# Patient Record
Sex: Female | Born: 1945 | Race: White | Hispanic: No | Marital: Married | Smoking: Never smoker
Health system: Southern US, Community
[De-identification: ages and names within clinical notes are randomized; demographics above are authoritative.]

## PROBLEM LIST (undated history)

## (undated) DIAGNOSIS — T7840XA Allergy, unspecified, initial encounter: Secondary | ICD-10-CM

## (undated) DIAGNOSIS — C50919 Malignant neoplasm of unspecified site of unspecified female breast: Secondary | ICD-10-CM

## (undated) DIAGNOSIS — K219 Gastro-esophageal reflux disease without esophagitis: Secondary | ICD-10-CM

## (undated) DIAGNOSIS — R0600 Dyspnea, unspecified: Secondary | ICD-10-CM

## (undated) DIAGNOSIS — Z8719 Personal history of other diseases of the digestive system: Secondary | ICD-10-CM

## (undated) DIAGNOSIS — I10 Essential (primary) hypertension: Secondary | ICD-10-CM

## (undated) DIAGNOSIS — R918 Other nonspecific abnormal finding of lung field: Secondary | ICD-10-CM

## (undated) DIAGNOSIS — J189 Pneumonia, unspecified organism: Secondary | ICD-10-CM

## (undated) DIAGNOSIS — E669 Obesity, unspecified: Secondary | ICD-10-CM

## (undated) DIAGNOSIS — H269 Unspecified cataract: Secondary | ICD-10-CM

## (undated) DIAGNOSIS — C4490 Unspecified malignant neoplasm of skin, unspecified: Secondary | ICD-10-CM

## (undated) DIAGNOSIS — Z973 Presence of spectacles and contact lenses: Secondary | ICD-10-CM

## (undated) DIAGNOSIS — M1990 Unspecified osteoarthritis, unspecified site: Secondary | ICD-10-CM

## (undated) HISTORY — PX: WISDOM TOOTH EXTRACTION: SHX21

## (undated) HISTORY — PX: ABDOMINAL HYSTERECTOMY: SHX81

## (undated) HISTORY — DX: Unspecified malignant neoplasm of skin, unspecified: C44.90

## (undated) HISTORY — DX: Essential (primary) hypertension: I10

## (undated) HISTORY — DX: Malignant neoplasm of unspecified site of unspecified female breast: C50.919

## (undated) HISTORY — DX: Obesity, unspecified: E66.9

## (undated) HISTORY — DX: Unspecified osteoarthritis, unspecified site: M19.90

## (undated) HISTORY — PX: SKIN CANCER EXCISION: SHX779

## (undated) HISTORY — PX: BREAST SURGERY: SHX581

## (undated) HISTORY — DX: Allergy, unspecified, initial encounter: T78.40XA

---

## 2019-06-30 ENCOUNTER — Other Ambulatory Visit: Payer: Self-pay

## 2019-06-30 ENCOUNTER — Ambulatory Visit (INDEPENDENT_AMBULATORY_CARE_PROVIDER_SITE_OTHER): Payer: Medicare Other | Admitting: Internal Medicine

## 2019-06-30 ENCOUNTER — Encounter: Payer: Self-pay | Admitting: Internal Medicine

## 2019-06-30 VITALS — BP 130/80 | HR 67 | Temp 98.2°F | Ht 67.0 in | Wt 184.0 lb

## 2019-06-30 DIAGNOSIS — R05 Cough: Secondary | ICD-10-CM

## 2019-06-30 DIAGNOSIS — R911 Solitary pulmonary nodule: Secondary | ICD-10-CM | POA: Diagnosis not present

## 2019-06-30 DIAGNOSIS — R918 Other nonspecific abnormal finding of lung field: Secondary | ICD-10-CM

## 2019-06-30 DIAGNOSIS — R053 Chronic cough: Secondary | ICD-10-CM

## 2019-06-30 LAB — CBC WITH DIFFERENTIAL/PLATELET
Basophils Absolute: 0 10*3/uL (ref 0.0–0.1)
Basophils Relative: 0.4 % (ref 0.0–3.0)
Eosinophils Absolute: 0.1 10*3/uL (ref 0.0–0.7)
Eosinophils Relative: 1.1 % (ref 0.0–5.0)
HCT: 42.9 % (ref 36.0–46.0)
Hemoglobin: 14.2 g/dL (ref 12.0–15.0)
Lymphocytes Relative: 36.7 % (ref 12.0–46.0)
Lymphs Abs: 3 10*3/uL (ref 0.7–4.0)
MCHC: 33.1 g/dL (ref 30.0–36.0)
MCV: 88.7 fl (ref 78.0–100.0)
Monocytes Absolute: 0.7 10*3/uL (ref 0.1–1.0)
Monocytes Relative: 8.2 % (ref 3.0–12.0)
Neutro Abs: 4.3 10*3/uL (ref 1.4–7.7)
Neutrophils Relative %: 53.6 % (ref 43.0–77.0)
Platelets: 228 10*3/uL (ref 150.0–400.0)
RBC: 4.83 Mil/uL (ref 3.87–5.11)
RDW: 13.9 % (ref 11.5–15.5)
WBC: 8.1 10*3/uL (ref 4.0–10.5)

## 2019-06-30 LAB — SEDIMENTATION RATE: Sed Rate: 18 mm/hr (ref 0–30)

## 2019-06-30 NOTE — Progress Notes (Signed)
Rebekah Taylor, female    DOB: [birth year]      MRN: [id number]    Brief patient profile:  40 yowf never smoker grew up in [place] where she went to [facility] [employer] and lived all over the country but no problem until 2010 after moving to [place] around 2007 with pnds and cough all year round but worse in spring seemed some better on clariton/flonase  Then dx pna Dec 2019 and since then developed chronic dry cough she felt was different from her usual and did not respond to singulair or zyrtec referred to pulmonary clinic 06/30/2019 by Dr   Teryl Lucy       L mastectomy no RT or chemo   In [place] in late Oct 2019 started running fever while there then early Dec 2020 was told she had pna, fever resolved but not the cough.  freq trips to [place]      History of Present Illness  06/30/2019  Pulmonary/ 1st office eval/Terria Deschepper  Chief Complaint  Patient presents with  . Pulmonary Consult    Referred by Dr.  Ledell Peoples for eval of cough and abnormal ct chest.  Pt c/o cough since Dec 2019. Cough is non prod and esp worse at night.  Dyspnea:  Now MMRC1 = can walk nl pace, flat grade, can't hurry or go uphills or steps s sob   Cough: dry daytime no flare up / use of voice worse  Sleep: fine on side  SABA use: none  Overt hb intermittent x 2018/ rx with rolaids  No obvious day to day or daytime variability or assoc excess/ purulent sputum or mucus plugs or hemoptysis or cp or chest tightness, subjective wheeze or overt sinus   symptoms.   Sleeps  without nocturnal  or early am exacerbation  of respiratory  c/o's or need for noct saba. Also denies any obvious fluctuation of symptoms with weather or environmental changes or other aggravating or alleviating factors except as outlined above   No unusual exposure hx or h/o childhood pna/ asthma or knowledge of premature birth.  Current Allergies, Complete Past Medical History, Past Surgical History, Family History, and Social  History were reviewed in [employer] record.  ROS  The following are not active complaints unless bolded Hoarseness, sore throat, dysphagia, dental problems, itching, sneezing,  nasal congestion or discharge of excess mucus or purulent secretions, ear ache,   fever, chills, sweats, unintended  wt loss or wt gain, classically pleuritic or exertional cp,  orthopnea pnd or arm/hand swelling  or leg swelling, presyncope, palpitations, abdominal pain, anorexia, nausea, vomiting, diarrhea  or change in bowel habits or change in bladder habits, change in stools or change in urine, dysuria, hematuria,  rash, arthralgias, visual complaints, headache, numbness, weakness or ataxia or problems with walking or coordination,  change in mood or  memory.           No past medical history on file.  Outpatient Medications Prior to Visit  Medication Sig Dispense Refill  . b complex vitamins tablet Take 1 tablet by mouth daily.    . cetirizine (ZYRTEC) 10 MG tablet Take 10 mg by mouth daily.    . cholecalciferol (VITAMIN D3) 25 MCG (1000 UT) tablet Take 1,000 Units by mouth daily.    . famotidine (PEPCID) 20 MG tablet Take 1 tablet by mouth daily.    . fluticasone (FLONASE) 50 MCG/ACT nasal spray Place 1 spray into both nostrils daily.    Marland Kitchen  hydrochlorothiazide (HYDRODIURIL) 12.5 MG tablet Take 1 tablet by mouth daily.    . montelukast (SINGULAIR) 10 MG tablet Take 10 mg by mouth daily.    . Omega-3 Fatty Acids (SALMON OIL-1000 PO) Take 1 capsule by mouth daily.    Marland Kitchen ZINC-VITAMIN C PO Take 1 tablet by mouth daily.        Objective:     BP 130/80 (BP Location: Left Arm, Cuff Size: Normal)   Pulse 67   Temp 98.2 F (36.8 C) (Oral)   Ht 5\' 7"  (1.702 m)   Wt 184 lb (83.5 kg)   SpO2 95%   BMI 28.82 kg/m   SpO2: 95 %  RA   HEENT: nl dentition, turbinates bilaterally, and oropharynx. Nl external ear canals without cough reflex   NECK :  without JVD/Nodes/TM/ nl carotid upstrokes  bilaterally   LUNGS: no acc muscle use,  Nl contour chest which is clear to A and P bilaterally with  cough on insp   maneuvers   CV:  RRR  no s3 or murmur or increase in P2, and no edema   ABD:  soft and nontender with nl inspiratory excursion in the supine position. No bruits or organomegaly appreciated, bowel sounds nl  MS:  Nl gait/ ext warm without deformities, calf tenderness, cyanosis or clubbing No obvious joint restrictions   SKIN: warm and dry without lesions    NEURO:  alert, approp, nl sensorium with  no motor or cerebellar deficits apparent.     I personally reviewed  radiology impression as follows:  Chest CT w contrast 05/30/2019 :    4.2 cm mass RUL with subcentimeter R hilar node (0.8 cm)       Assessment   Mass of upper lobe of right lung Never smoker but h/o L breast ca sp mastectomy only, no chemo or RT (she declined)   - Exp to histo repeatedly in [place] [facility] 06/30/2019  - Cryto Ag 06/30/2019  - Quant TB 06/30/2019  - PET 07/01/2019   Given the size of the mass unfortunately it sounds more likely to be neoplasm than a granulomatous process  so will proceed with PET then consider excisional bx/ RUlobectormor Navigational bx with EBUS depending on the radiologic staging.   Discussed in detail all the  indications, usual  risks and alternatives  relative to the benefits with patient who agrees to proceed with w/u as outlined.         Chronic cough Onset dec 2019 p ? pna suggesting this may have been related to the RUL mass though the features (dry, worse with insp, assoc with HB)  are now more typical of Upper airway cough syndrome (previously labeled PNDS),  is so named because it's frequently impossible to sort out how much is  CR/sinusitis with freq throat clearing (which can be related to primary GERD)   vs  causing  secondary (" extra esophageal")  GERD from wide swings in gastric pressure that occur with throat clearing, often   promoting self use of mint and menthol lozenges that reduce the lower esophageal sphincter tone and exacerbate the problem further in a cyclical fashion.   These are the same pts (now being labeled as having "irritable larynx syndrome" by some cough centers) who not infrequently have a history of having failed to tolerate ace inhibitors,  dry powder inhalers or biphosphonates or report having atypical/extraesophageal reflux symptoms that don't respond to standard doses of PPI  and are easily confused as having aecopd or asthma flares by even experienced allergists/ pulmonologists (myself included).    >>> rec max rx for gerd while awaiting results of PET    Total time devoted to counseling  > 50 % of initial 60 min office visit:  review case with pt/ discussion of options/alternatives/ personally creating written customized instructions  in presence of pt  then going over those specific  Instructions directly with the pt including how to use all of the meds but in particular covering each new medication in detail and the difference between the maintenance= "automatic" meds and the prns using an action plan format for the latter (If this problem/symptom => do that organization reading Left to right).  Please see AVS from this visit for a full list of these instructions which I personally wrote for this pt and  are unique to this visit.     Christinia Gully, MD 06/30/2019

## 2019-06-30 NOTE — Patient Instructions (Addendum)
Please remember to go to the lab department   for your tests - we will call you with the results when they are available.      We will call to set up the PET Scan and call you with the results   Try prilosec otc 20mg   Take 30-60 min before first meal of the day and Pepcid ac (famotidine) 20 mg one @  bedtime until cough is completely gone for at least a week without the need for cough suppression      GERD (REFLUX)  is an extremely common cause of respiratory symptoms just like yours , many times with no obvious heartburn at all.    It can be treated with medication, but also with lifestyle changes including elevation of the head of your bed (ideally with 6 -8inch blocks under the headboard of your bed),  Smoking cessation, avoidance of late meals, excessive alcohol, and avoid fatty foods, chocolate, peppermint, colas, red wine, and acidic juices such as orange juice.  NO MINT OR MENTHOL PRODUCTS SO NO COUGH DROPS  USE SUGARLESS CANDY INSTEAD (Jolley ranchers or Stover's or Life Savers) or even ice chips will also do - the key is to swallow to prevent all throat clearing. NO OIL BASED VITAMINS - use powdered substitutes.  Avoid fish oil when coughing.

## 2019-07-01 ENCOUNTER — Encounter: Payer: Self-pay | Admitting: Internal Medicine

## 2019-07-01 DIAGNOSIS — R05 Cough: Secondary | ICD-10-CM | POA: Insufficient documentation

## 2019-07-01 DIAGNOSIS — R053 Chronic cough: Secondary | ICD-10-CM | POA: Insufficient documentation

## 2019-07-01 NOTE — Assessment & Plan Note (Addendum)
Never smoker but h/o L breast ca sp mastectomy only, no chemo or RT (she declined)   - Exp to histo repeatedly in [place] [facility] 06/30/2019  - Cryto Ag 06/30/2019  - Quant TB 06/30/2019  - PET 07/01/2019   Given the size of the mass unfortunately it sounds more likely to be neoplasm than a granulomatous process  so will proceed with PET then consider excisional bx/ RUlobectormor Navigational bx with EBUS depending on the radiologic staging.   Discussed in detail all the  indications, usual  risks and alternatives  relative to the benefits with patient who agrees to proceed with w/u as outlined.

## 2019-07-01 NOTE — Assessment & Plan Note (Addendum)
Onset dec 2019 p ? pna suggesting this may have been related to the RUL mass though the features (dry, worse with insp, assoc with HB)  are now more typical of Upper airway cough syndrome (previously labeled PNDS),  is so named because it's frequently impossible to sort out how much is  CR/sinusitis with freq throat clearing (which can be related to primary GERD)   vs  causing  secondary (" extra esophageal")  GERD from wide swings in gastric pressure that occur with throat clearing, often  promoting self use of mint and menthol lozenges that reduce the lower esophageal sphincter tone and exacerbate the problem further in a cyclical fashion.   These are the same pts (now being labeled as having "irritable larynx syndrome" by some cough centers) who not infrequently have a history of having failed to tolerate ace inhibitors,  dry powder inhalers or biphosphonates or report having atypical/extraesophageal reflux symptoms that don't respond to standard doses of PPI  and are easily confused as having aecopd or asthma flares by even experienced allergists/ pulmonologists (myself included).    >>> rec max rx for gerd while awaiting results of PET    Total time devoted to counseling  > 50 % of initial 60 min office visit:  review case with pt/ discussion of options/alternatives/ personally creating written customized instructions  in presence of pt  then going over those specific  Instructions directly with the pt including how to use all of the meds but in particular covering each new medication in detail and the difference between the maintenance= "automatic" meds and the prns using an action plan format for the latter (If this problem/symptom => do that organization reading Left to right).  Please see AVS from this visit for a full list of these instructions which I personally wrote for this pt and  are unique to this visit.

## 2019-07-02 LAB — QUANTIFERON-TB GOLD PLUS
Mitogen-NIL: 6.9 IU/mL
NIL: 0.01 IU/mL
QuantiFERON-TB Gold Plus: NEGATIVE
TB1-NIL: 0.01 IU/mL
TB2-NIL: 0.01 IU/mL

## 2019-07-03 LAB — CRYPTOCOCCAL ANTIGEN: Cryptococcus Antigen, Serum: NEGATIVE

## 2019-07-03 NOTE — Progress Notes (Signed)
Spoke with pt and notified of results per Dr. Wert. Pt verbalized understanding and denied any questions. 

## 2019-07-06 ENCOUNTER — Other Ambulatory Visit: Payer: Self-pay

## 2019-07-06 ENCOUNTER — Ambulatory Visit (HOSPITAL_COMMUNITY)
Admission: RE | Admit: 2019-07-06 | Discharge: 2019-07-06 | Disposition: A | Discharge status: Home / Self Care | Payer: Medicare Other | Source: Ambulatory Visit | Attending: Internal Medicine | Admitting: Internal Medicine

## 2019-07-06 ENCOUNTER — Telehealth: Payer: Self-pay

## 2019-07-06 DIAGNOSIS — Z79899 Other long term (current) drug therapy: Secondary | ICD-10-CM | POA: Diagnosis not present

## 2019-07-06 DIAGNOSIS — K8020 Calculus of gallbladder without cholecystitis without obstruction: Secondary | ICD-10-CM | POA: Insufficient documentation

## 2019-07-06 DIAGNOSIS — R918 Other nonspecific abnormal finding of lung field: Secondary | ICD-10-CM

## 2019-07-06 DIAGNOSIS — Z85820 Personal history of malignant melanoma of skin: Secondary | ICD-10-CM | POA: Insufficient documentation

## 2019-07-06 DIAGNOSIS — E041 Nontoxic single thyroid nodule: Secondary | ICD-10-CM | POA: Insufficient documentation

## 2019-07-06 DIAGNOSIS — R911 Solitary pulmonary nodule: Secondary | ICD-10-CM | POA: Insufficient documentation

## 2019-07-06 LAB — RESPIRATORY ALLERGY PROFILE REGION II ~~LOC~~

## 2019-07-06 LAB — GLUCOSE, CAPILLARY: Glucose-Capillary: 78 mg/dL (ref 70–99)

## 2019-07-06 LAB — HISTOPLASMA ANTIBODIES: Histoplasma Ab, Immunodiffusion: NEGATIVE

## 2019-07-06 LAB — INTERPRETATION:

## 2019-07-06 MED ORDER — FLUDEOXYGLUCOSE F - 18 (FDG) INJECTION: 10.1000 | Freq: Once | INTRAVENOUS | Status: DC | PRN

## 2019-07-06 NOTE — Telephone Encounter (Signed)
Patient dropped by office to drop off a disc for MW to review. She voiced that she will need the disc back.   Will route message to MW as Juluis Rainier. Disc placed on MW desk for review.

## 2019-07-07 ENCOUNTER — Other Ambulatory Visit: Payer: Self-pay | Admitting: Internal Medicine

## 2019-07-07 ENCOUNTER — Ambulatory Visit (HOSPITAL_COMMUNITY): Payer: TRICARE For Life (TFL)

## 2019-07-07 DIAGNOSIS — R918 Other nonspecific abnormal finding of lung field: Secondary | ICD-10-CM

## 2019-07-10 NOTE — Telephone Encounter (Signed)
Pt did not drop the disc off at PET scan as requested and it can't be opened on our computer but apparently from a very recent ct chest that won't give much of a serial view of the problem anyway so instructed to pick up the disc and show it to T surgery in case there is interest from their perspective

## 2019-07-12 ENCOUNTER — Telehealth: Payer: Self-pay | Admitting: Internal Medicine

## 2019-07-12 NOTE — Telephone Encounter (Signed)
This disc has been placed up front per Tilden Dome, CMA. Pt is aware of this information. Nothing further was needed.

## 2019-07-17 ENCOUNTER — Other Ambulatory Visit (HOSPITAL_COMMUNITY): Payer: TRICARE For Life (TFL)

## 2019-07-19 ENCOUNTER — Encounter: Payer: TRICARE For Life (TFL) | Admitting: Thoracic Surgery (Cardiothoracic Vascular Surgery)

## 2019-07-20 ENCOUNTER — Other Ambulatory Visit: Payer: Self-pay | Admitting: *Deleted

## 2019-07-20 ENCOUNTER — Other Ambulatory Visit: Payer: Self-pay

## 2019-07-20 ENCOUNTER — Institutional Professional Consult (permissible substitution) (INDEPENDENT_AMBULATORY_CARE_PROVIDER_SITE_OTHER): Payer: Medicare Other | Admitting: Thoracic Surgery (Cardiothoracic Vascular Surgery)

## 2019-07-20 ENCOUNTER — Encounter: Payer: Self-pay | Admitting: Thoracic Surgery (Cardiothoracic Vascular Surgery)

## 2019-07-20 VITALS — BP 147/82 | HR 72 | Temp 97.9°F | Resp 20 | Ht 67.0 in | Wt 180.0 lb

## 2019-07-20 DIAGNOSIS — Z853 Personal history of malignant neoplasm of breast: Secondary | ICD-10-CM

## 2019-07-20 DIAGNOSIS — I10 Essential (primary) hypertension: Secondary | ICD-10-CM | POA: Insufficient documentation

## 2019-07-20 DIAGNOSIS — R918 Other nonspecific abnormal finding of lung field: Secondary | ICD-10-CM

## 2019-07-20 DIAGNOSIS — R591 Generalized enlarged lymph nodes: Secondary | ICD-10-CM

## 2019-07-20 DIAGNOSIS — R599 Enlarged lymph nodes, unspecified: Secondary | ICD-10-CM

## 2019-07-20 NOTE — Progress Notes (Signed)
PCP is Patient, No Pcp Per Referring Provider is Tanda Rockers, MD  Chief Complaint  Patient presents with  . 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  She is a lifelong non-smoker.  She was being evaluated for a chronic cough.  She first noted the cough last fall.  She was treated for pneumonia in December.  She has had a chronic dry cough since then.  She does also have some reflux symptoms.  During her evaluation she had a chest x-ray which showed a right upper lobe opacity.  A CT of the chest was done showed a 3.4 x 1.6 x 4.2 cm mass in the right upper lobe, a 7 to 8 mm nodule in the right lower lobe, multiple small nodules along the major fissure, and some borderline hilar adenopathy.  She saw Dr. Melvyn Novas.  QuantiFERON gold and fungal testing was negative.  He did a PET CT which showed the upper lobe mass was hypermetabolic.  There also was activity in a right lower lobe nodule and also adjacent to the right lower lobe bronchus.  She is a lifelong non-smoker.  She has a chronic dry cough.  No wheezing or hemoptysis.  She does have frequent heartburn.  Bruises easily but no history of prolonged bleeding.  No unusual headaches or visual changes.  She does get short of breath if she walks up a flight of stairs, but does not have to stop.  Zubrod Score: At the time of surgery this patient's most appropriate activity status/level should be described as: []     0    Normal activity, no symptoms [x]     1    Restricted in physical strenuous activity but ambulatory, able to do out light work []     2    Ambulatory and capable of self care, unable to do work activities, up and about >50 % of waking hours                              []      3    Only limited self care, in bed greater than 50% of waking hours []     4    Completely disabled, no self care, confined to bed or chair []     5    Moribund  Past Medical History:  Diagnosis Date  . Allergies   . Arthritis   . Breast cancer ([place])   . Hypertension   . Obesity   . Skin cancer     Past Surgical History:  Procedure Laterality Date  . ABDOMINAL HYSTERECTOMY    . BREAST SURGERY      Family History  Problem Relation Age of Onset  . COPD Mother   . Cancer Father   . COPD Father   . Heart disease Father   . Hyperlipidemia Sister   . Hypertension Sister     Social History Social History   Tobacco Use  . Smoking status: Never Smoker  . Smokeless tobacco: Never Used  Substance Use Topics  . Alcohol use: Not Currently  . Drug use: Never    Current Outpatient Medications  Medication Sig Dispense Refill  . b complex vitamins  tablet Take 1 tablet by mouth daily.    . cetirizine (ZYRTEC) 10 MG tablet Take 10 mg by mouth daily.    . cholecalciferol (VITAMIN D3) 25 MCG (1000 UT) tablet Take 1,000 Units by mouth daily.    . famotidine (PEPCID) 20 MG tablet Take 1 tablet by mouth daily.    . fluticasone (FLONASE) 50 MCG/ACT nasal spray Place 1 spray into both nostrils daily.    . hydrochlorothiazide (HYDRODIURIL) 12.5 MG tablet Take 1 tablet by mouth daily.    . montelukast (SINGULAIR) 10 MG tablet Take 10 mg by mouth daily.    Marland Kitchen ZINC-VITAMIN C PO Take 1 tablet by mouth daily.     No current facility-administered medications for this visit.     No Known Allergies  Review of Systems  Constitutional: Negative for activity change, fatigue and unexpected weight change.  HENT: Negative for trouble swallowing and voice change.   Eyes: Negative for visual disturbance.  Respiratory: Positive for cough and shortness of breath (With exertion). Negative for wheezing.   Cardiovascular: Positive for chest pain (Reflux). Negative for leg swelling.  Gastrointestinal:  Positive for abdominal pain (Reflux). Negative for abdominal distention.  Endocrine: Negative for polydipsia and polyphagia.  Genitourinary: Negative for difficulty urinating and dysuria.  Musculoskeletal: Positive for arthralgias and joint swelling.  Neurological: Negative for syncope and weakness.  Hematological: Negative for adenopathy. Bruises/bleeds easily.  All other systems reviewed and are negative.   BP (!) 147/82   Pulse 72   Temp 97.9 F (36.6 C) (Skin)   Resp 20   Ht 5\' 7"  (1.702 m)   Wt 180 lb (81.6 kg)   SpO2 95% Comment: RA  BMI 28.19 kg/m  Physical Exam Vitals signs reviewed.  Constitutional:      General: She is not in acute distress.    Appearance: She is obese.  HENT:     Head: Normocephalic and atraumatic.     Comments: Wearing surgical mask Eyes:     General: No scleral icterus.    Extraocular Movements: Extraocular movements intact.  Neck:     Musculoskeletal: Neck supple.     Vascular: No carotid bruit.  Cardiovascular:     Rate and Rhythm: Normal rate and regular rhythm.     Heart sounds: Normal heart sounds. No murmur. No friction rub. No gallop.   Pulmonary:     Effort: Pulmonary effort is normal. No respiratory distress.     Breath sounds: No wheezing.  Abdominal:     General: Bowel sounds are normal. There is no distension.     Palpations: Abdomen is soft.     Tenderness: There is no abdominal tenderness.  Musculoskeletal:        General: No swelling.  Lymphadenopathy:     Cervical: No cervical adenopathy.  Skin:    General: Skin is warm and dry.  Neurological:     General: No focal deficit present.     Mental Status: She is alert and oriented to person, place, and time.     Cranial Nerves: No cranial nerve deficit.     Motor: No weakness.    Diagnostic Tests: NUCLEAR MEDICINE PET SKULL BASE TO THIGH  TECHNIQUE: 10.1 mCi F-18 FDG was injected intravenously. Full-ring PET imaging was performed from the skull base to thigh after  the radiotracer. CT data was obtained and used for attenuation correction and anatomic localization.  Fasting blood glucose: 78 mg/dl  COMPARISON:  PET-CT 02/16/2013.  FINDINGS: Mediastinal blood pool activity: SUV  max 3.2  Liver activity: SUV max NA  NECK: No hypermetabolic lymph nodes in the neck.  Incidental CT findings: 9 mm nodule left thyroid lobe noted without hypermetabolism.  CHEST: No hypermetabolic mediastinal or left hilar lymphadenopathy. No hypermetabolic axillary lymphadenopathy focus of hypermetabolism identified in the inferior right hilar region, adjacent to the right lower lobe bronchus. SUV max = 4.2. No pulmonary nodule or lymph node evident on non breath hold noncontrast CT.  3.0 x 1.6 x 4.2 cm irregular right upper lobe lesion is hypermetabolic with SUV max = 6.2.  A second 9 mm right lower lobe nodule (42/8) shows low level FDG accumulation with SUV max = 2.2.  Several nodules are seen along the major fissure of the right lung, measuring up to 5 mm (36/8). No discernible hypermetabolism.  Atelectasis or scarring noted in the posterior lingula. No pleural effusion.  Incidental CT findings: Atherosclerotic calcification is noted in the wall of the thoracic aorta.  ABDOMEN/PELVIS: No abnormal hypermetabolic activity within the liver, pancreas, adrenal glands, or spleen. No hypermetabolic lymph nodes in the abdomen or pelvis.  Incidental CT findings: Tiny calcified gallstones evident. There is abdominal aortic atherosclerosis without aneurysm.  SKELETON: No focal hypermetabolic activity to suggest skeletal metastasis.  Incidental CT findings: none  IMPRESSION: 1. 3 x 1.6 x 4.2 cm irregular lesion in the right upper lobe is hypermetabolic, concerning for neoplasm. Primary bronchogenic neoplasm a distinct concern. Given the history of malignant melanoma, metastatic disease is considered less likely but not excluded. 2. 9 mm  nodule right lower lobe with discernible FDG accumulation. This is not well seen given the non breath hold CT images. Although FDG uptake is low level, given the small size of this nodule, neoplasm is a concern. Synchronous primary and metastatic disease would both be considerations. 3. Focus of hypermetabolism in the inferior right hilar region without underlying pulmonary nodule or lymph node evident. CT chest with contrast may prove helpful to further assess. 4. 9 mm left thyroid nodule without hypermetabolism. 5. Cholelithiasis.   Electronically Signed   By: Misty Stanley M.D.   On: 07/07/2019 08:34 I personally reviewed the CT images and concur with the findings noted above  Impression: Rebekah Taylor is a 73 year old woman who is a lifelong non-smoker.  She does have a past medical history significant for hypertension, reflux, remote breast cancer, and a non-melanoma skin cancer.  She presented with a chronic nonproductive cough.  Work-up has revealed a 3.4 x 1.6 x 4.2 cm irregular right upper lobe mass.  This mass is hypermetabolic on PET/CT.  Is obviously concerning for a primary bronchogenic carcinoma.  It also could represent granulomatous disease or chronic infectious/inflammatory disease.  However, those are far less likely.  Unfortunately she also has a hypermetabolic nodule in the central portion of the right lower lobe.  This is not well seen on the CT images along with the PET.  It is visible on the CT done in [place].  There is some activity adjacent to the right lower lobe bronchus/bronchus intermedius.  That does correlate with a 7 mm node on the CT done in [place].  There also are multiple small nodules along the fissure between the upper and lower lobes.  Those were too small to characterize by PET.  Given that she has a nodule in the lower lobe and possible adenopathy, I think the next step in her evaluation would be a navigational bronchoscopy and endobronchial  ultrasound.  That will allow [place] to sample  both the upper and lower lobe lesions as well as the hilar node.  She understands there is no guarantee of a definitive diagnosis but I think odds are good that we will get a definitive answer at least on the upper lobe lesion.  I described the proposed procedure to Mrs. Schnick and her husband.  They understand it is an endoscopic procedure that would be done under general anesthesia.  I informed him of the indications, risks, benefits, and alternatives.  They understand the risks include, but are not limited to death, MI, DVT, PE, bleeding, pneumothorax, failure to make a diagnosis, as well as possibility of other unforeseeable complications.  She accepts the risks and wishes to proceed.  I do think she will need a new CT scan and super D protocol prior to the procedure.  Plan: Electromagnetic navigational bronchoscopy and endobronchial ultrasound on Monday, 07/24/2019  [name], MD [facility] [phone]

## 2019-07-20 NOTE — H&P (View-Only) (Signed)
PCP is Patient, No Pcp Per Referring Provider is Tanda Rockers, MD  Chief Complaint  Patient presents with  . Lung Mass    Surgical eval, PET Scan 07/06/19,     HPI: Mrs. 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  She is a lifelong non-smoker.  She was being evaluated for a chronic cough.  She first noted the cough last fall.  She was treated for pneumonia in December.  She has had a chronic dry cough since then.  She does also have some reflux symptoms.  During her evaluation she had a chest x-ray which showed a right upper lobe opacity.  A CT of the chest was done showed a 3.4 x 1.6 x 4.2 cm mass in the right upper lobe, a 7 to 8 mm nodule in the right lower lobe, multiple small nodules along the major fissure, and some borderline hilar adenopathy.  She saw Dr. Melvyn Novas.  QuantiFERON gold and fungal testing was negative.  He did a PET CT which showed the upper lobe mass was hypermetabolic.  There also was activity in a right lower lobe nodule and also adjacent to the right lower lobe bronchus.  She is a lifelong non-smoker.  She has a chronic dry cough.  No wheezing or hemoptysis.  She does have frequent heartburn.  Bruises easily but no history of prolonged bleeding.  No unusual headaches or visual changes.  She does get short of breath if she walks up a flight of stairs, but does not have to stop.  Zubrod Score: At the time of surgery this patient's most appropriate activity status/level should be described as: []     0    Normal activity, no symptoms [x]     1    Restricted in physical strenuous activity but ambulatory, able to do out light work []     2    Ambulatory and capable of self care, unable to do work activities, up and about >50 % of waking hours                              []      3    Only limited self care, in bed greater than 50% of waking hours []     4    Completely disabled, no self care, confined to bed or chair []     5    Moribund  Past Medical History:  Diagnosis Date  . Allergies   . Arthritis   . Breast cancer ([place])   . Hypertension   . Obesity   . Skin cancer     Past Surgical History:  Procedure Laterality Date  . ABDOMINAL HYSTERECTOMY    . BREAST SURGERY      Family History  Problem Relation Age of Onset  . COPD Mother   . Cancer Father   . COPD Father   . Heart disease Father   . Hyperlipidemia Sister   . Hypertension Sister     Social History Social History   Tobacco Use  . Smoking status: Never Smoker  . Smokeless tobacco: Never Used  Substance Use Topics  . Alcohol use: Not Currently  . Drug use: Never    Current Outpatient Medications  Medication Sig Dispense Refill  . b complex vitamins  tablet Take 1 tablet by mouth daily.    . cetirizine (ZYRTEC) 10 MG tablet Take 10 mg by mouth daily.    . cholecalciferol (VITAMIN D3) 25 MCG (1000 UT) tablet Take 1,000 Units by mouth daily.    . famotidine (PEPCID) 20 MG tablet Take 1 tablet by mouth daily.    . fluticasone (FLONASE) 50 MCG/ACT nasal spray Place 1 spray into both nostrils daily.    . hydrochlorothiazide (HYDRODIURIL) 12.5 MG tablet Take 1 tablet by mouth daily.    . montelukast (SINGULAIR) 10 MG tablet Take 10 mg by mouth daily.    Marland Kitchen ZINC-VITAMIN C PO Take 1 tablet by mouth daily.     No current facility-administered medications for this visit.     No Known Allergies  Review of Systems  Constitutional: Negative for activity change, fatigue and unexpected weight change.  HENT: Negative for trouble swallowing and voice change.   Eyes: Negative for visual disturbance.  Respiratory: Positive for cough and shortness of breath (With exertion). Negative for wheezing.   Cardiovascular: Positive for chest pain (Reflux). Negative for leg swelling.  Gastrointestinal:  Positive for abdominal pain (Reflux). Negative for abdominal distention.  Endocrine: Negative for polydipsia and polyphagia.  Genitourinary: Negative for difficulty urinating and dysuria.  Musculoskeletal: Positive for arthralgias and joint swelling.  Neurological: Negative for syncope and weakness.  Hematological: Negative for adenopathy. Bruises/bleeds easily.  All other systems reviewed and are negative.   BP (!) 147/82   Pulse 72   Temp 97.9 F (36.6 C) (Skin)   Resp 20   Ht 5\' 7"  (1.702 m)   Wt 180 lb (81.6 kg)   SpO2 95% Comment: RA  BMI 28.19 kg/m  Physical Exam Vitals signs reviewed.  Constitutional:      General: She is not in acute distress.    Appearance: She is obese.  HENT:     Head: Normocephalic and atraumatic.     Comments: Wearing surgical mask Eyes:     General: No scleral icterus.    Extraocular Movements: Extraocular movements intact.  Neck:     Musculoskeletal: Neck supple.     Vascular: No carotid bruit.  Cardiovascular:     Rate and Rhythm: Normal rate and regular rhythm.     Heart sounds: Normal heart sounds. No murmur. No friction rub. No gallop.   Pulmonary:     Effort: Pulmonary effort is normal. No respiratory distress.     Breath sounds: No wheezing.  Abdominal:     General: Bowel sounds are normal. There is no distension.     Palpations: Abdomen is soft.     Tenderness: There is no abdominal tenderness.  Musculoskeletal:        General: No swelling.  Lymphadenopathy:     Cervical: No cervical adenopathy.  Skin:    General: Skin is warm and dry.  Neurological:     General: No focal deficit present.     Mental Status: She is alert and oriented to person, place, and time.     Cranial Nerves: No cranial nerve deficit.     Motor: No weakness.    Diagnostic Tests: NUCLEAR MEDICINE PET SKULL BASE TO THIGH  TECHNIQUE: 10.1 mCi F-18 FDG was injected intravenously. Full-ring PET imaging was performed from the skull base to thigh after  the radiotracer. CT data was obtained and used for attenuation correction and anatomic localization.  Fasting blood glucose: 78 mg/dl  COMPARISON:  PET-CT 02/16/2013.  FINDINGS: Mediastinal blood pool activity: SUV  max 3.2  Liver activity: SUV max NA  NECK: No hypermetabolic lymph nodes in the neck.  Incidental CT findings: 9 mm nodule left thyroid lobe noted without hypermetabolism.  CHEST: No hypermetabolic mediastinal or left hilar lymphadenopathy. No hypermetabolic axillary lymphadenopathy focus of hypermetabolism identified in the inferior right hilar region, adjacent to the right lower lobe bronchus. SUV max = 4.2. No pulmonary nodule or lymph node evident on non breath hold noncontrast CT.  3.0 x 1.6 x 4.2 cm irregular right upper lobe lesion is hypermetabolic with SUV max = 6.2.  A second 9 mm right lower lobe nodule (42/8) shows low level FDG accumulation with SUV max = 2.2.  Several nodules are seen along the major fissure of the right lung, measuring up to 5 mm (36/8). No discernible hypermetabolism.  Atelectasis or scarring noted in the posterior lingula. No pleural effusion.  Incidental CT findings: Atherosclerotic calcification is noted in the wall of the thoracic aorta.  ABDOMEN/PELVIS: No abnormal hypermetabolic activity within the liver, pancreas, adrenal glands, or spleen. No hypermetabolic lymph nodes in the abdomen or pelvis.  Incidental CT findings: Tiny calcified gallstones evident. There is abdominal aortic atherosclerosis without aneurysm.  SKELETON: No focal hypermetabolic activity to suggest skeletal metastasis.  Incidental CT findings: none  IMPRESSION: 1. 3 x 1.6 x 4.2 cm irregular lesion in the right upper lobe is hypermetabolic, concerning for neoplasm. Primary bronchogenic neoplasm a distinct concern. Given the history of malignant melanoma, metastatic disease is considered less likely but not excluded. 2. 9 mm  nodule right lower lobe with discernible FDG accumulation. This is not well seen given the non breath hold CT images. Although FDG uptake is low level, given the small size of this nodule, neoplasm is a concern. Synchronous primary and metastatic disease would both be considerations. 3. Focus of hypermetabolism in the inferior right hilar region without underlying pulmonary nodule or lymph node evident. CT chest with contrast may prove helpful to further assess. 4. 9 mm left thyroid nodule without hypermetabolism. 5. Cholelithiasis.   Electronically Signed   By: Misty Stanley M.D.   On: 07/07/2019 08:34 I personally reviewed the CT images and concur with the findings noted above  Impression: Rebekah Taylor is a 73 year old woman who is a lifelong non-smoker.  She does have a past medical history significant for hypertension, reflux, remote breast cancer, and a non-melanoma skin cancer.  She presented with a chronic nonproductive cough.  Work-up has revealed a 3.4 x 1.6 x 4.2 cm irregular right upper lobe mass.  This mass is hypermetabolic on PET/CT.  Is obviously concerning for a primary bronchogenic carcinoma.  It also could represent granulomatous disease or chronic infectious/inflammatory disease.  However, those are far less likely.  Unfortunately she also has a hypermetabolic nodule in the central portion of the right lower lobe.  This is not well seen on the CT images along with the PET.  It is visible on the CT done in [place].  There is some activity adjacent to the right lower lobe bronchus/bronchus intermedius.  That does correlate with a 7 mm node on the CT done in [place].  There also are multiple small nodules along the fissure between the upper and lower lobes.  Those were too small to characterize by PET.  Given that she has a nodule in the lower lobe and possible adenopathy, I think the next step in her evaluation would be a navigational bronchoscopy and endobronchial  ultrasound.  That will allow [place] to sample  both the upper and lower lobe lesions as well as the hilar node.  She understands there is no guarantee of a definitive diagnosis but I think odds are good that we will get a definitive answer at least on the upper lobe lesion.  I described the proposed procedure to Mrs. Meuser and her husband.  They understand it is an endoscopic procedure that would be done under general anesthesia.  I informed him of the indications, risks, benefits, and alternatives.  They understand the risks include, but are not limited to death, MI, DVT, PE, bleeding, pneumothorax, failure to make a diagnosis, as well as possibility of other unforeseeable complications.  She accepts the risks and wishes to proceed.  I do think she will need a new CT scan and super D protocol prior to the procedure.  Plan: Electromagnetic navigational bronchoscopy and endobronchial ultrasound on Monday, 07/24/2019  [name], MD [facility] [phone]

## 2019-07-21 ENCOUNTER — Encounter (HOSPITAL_COMMUNITY): Payer: Self-pay | Admitting: *Deleted

## 2019-07-21 ENCOUNTER — Ambulatory Visit (HOSPITAL_COMMUNITY)
Admission: RE | Admit: 2019-07-21 | Discharge: 2019-07-21 | Disposition: A | Discharge status: Home / Self Care | Payer: Medicare Other | Source: Ambulatory Visit | Attending: Thoracic Surgery (Cardiothoracic Vascular Surgery) | Admitting: Thoracic Surgery (Cardiothoracic Vascular Surgery)

## 2019-07-21 ENCOUNTER — Other Ambulatory Visit (HOSPITAL_COMMUNITY)
Admission: RE | Admit: 2019-07-21 | Discharge: 2019-07-21 | Disposition: A | Discharge status: Home / Self Care | Payer: Medicare Other | Source: Ambulatory Visit | Attending: Thoracic Surgery (Cardiothoracic Vascular Surgery) | Admitting: Thoracic Surgery (Cardiothoracic Vascular Surgery)

## 2019-07-21 ENCOUNTER — Other Ambulatory Visit: Payer: Self-pay

## 2019-07-21 DIAGNOSIS — R591 Generalized enlarged lymph nodes: Secondary | ICD-10-CM

## 2019-07-21 DIAGNOSIS — R918 Other nonspecific abnormal finding of lung field: Secondary | ICD-10-CM

## 2019-07-21 DIAGNOSIS — Z20828 Contact with and (suspected) exposure to other viral communicable diseases: Secondary | ICD-10-CM | POA: Insufficient documentation

## 2019-07-21 DIAGNOSIS — R599 Enlarged lymph nodes, unspecified: Secondary | ICD-10-CM

## 2019-07-21 NOTE — Progress Notes (Signed)
Pt denies SOB, chest pain, and being under the care of a cardiologist. Pt denies having a cardiac cath but stated that both a stress test and echo were performed > 10 years ago. Py denies having an EKG within the last year. Pt denies recent labs. Pt made aware to stop taking vitamins, fish oil and herbal medications. Do not take any NSAIDs ie: Ibuprofen, Advil, Naproxen (Aleve), Motrin, BC and Goody Powder. Pt COVID-19 test result pending (tested 07/21/19); pt reminded to quarantine.   Coronavirus Screening  Pt denies that she and family members experienced the following symptoms:  Cough yes/no: No Fever (>100.85F)  yes/no: No Runny nose yes/no: No Sore throat yes/no: No Difficulty breathing/shortness of breath  yes/no: No  Have you or a family member traveled in the last 14 days and where? yes/no: No  Pt reminded that hospital visitation restrictions are in effect and the importance of the restrictions.   Pt verbalized understanding of all pre-op instructions.

## 2019-07-22 LAB — SARS CORONAVIRUS 2 (TAT 6-24 HRS): SARS Coronavirus 2: NEGATIVE

## 2019-07-24 ENCOUNTER — Encounter (HOSPITAL_COMMUNITY): Payer: Self-pay

## 2019-07-24 ENCOUNTER — Ambulatory Visit (HOSPITAL_COMMUNITY): Payer: Medicare Other | Admitting: Anesthesiology

## 2019-07-24 ENCOUNTER — Ambulatory Visit (HOSPITAL_COMMUNITY)
Admission: RE | Admit: 2019-07-24 | Discharge: 2019-07-24 | Disposition: A | Discharge status: Home / Self Care | Payer: Medicare Other | Source: Ambulatory Visit | Attending: Thoracic Surgery (Cardiothoracic Vascular Surgery) | Admitting: Thoracic Surgery (Cardiothoracic Vascular Surgery)

## 2019-07-24 ENCOUNTER — Ambulatory Visit (HOSPITAL_COMMUNITY): Payer: Medicare Other

## 2019-07-24 ENCOUNTER — Encounter (HOSPITAL_COMMUNITY)
Admission: RE | Disposition: A | Discharge status: Home / Self Care | Payer: Self-pay | Source: Ambulatory Visit | Attending: Thoracic Surgery (Cardiothoracic Vascular Surgery)

## 2019-07-24 ENCOUNTER — Other Ambulatory Visit: Payer: Self-pay

## 2019-07-24 DIAGNOSIS — Z85828 Personal history of other malignant neoplasm of skin: Secondary | ICD-10-CM | POA: Insufficient documentation

## 2019-07-24 DIAGNOSIS — I10 Essential (primary) hypertension: Secondary | ICD-10-CM | POA: Diagnosis not present

## 2019-07-24 DIAGNOSIS — R918 Other nonspecific abnormal finding of lung field: Secondary | ICD-10-CM

## 2019-07-24 DIAGNOSIS — R599 Enlarged lymph nodes, unspecified: Secondary | ICD-10-CM

## 2019-07-24 DIAGNOSIS — Z79899 Other long term (current) drug therapy: Secondary | ICD-10-CM | POA: Insufficient documentation

## 2019-07-24 DIAGNOSIS — Z01818 Encounter for other preprocedural examination: Secondary | ICD-10-CM

## 2019-07-24 DIAGNOSIS — Z6828 Body mass index (BMI) 28.0-28.9, adult: Secondary | ICD-10-CM | POA: Diagnosis not present

## 2019-07-24 DIAGNOSIS — R05 Cough: Secondary | ICD-10-CM | POA: Diagnosis not present

## 2019-07-24 DIAGNOSIS — R591 Generalized enlarged lymph nodes: Secondary | ICD-10-CM

## 2019-07-24 DIAGNOSIS — R590 Localized enlarged lymph nodes: Secondary | ICD-10-CM | POA: Diagnosis not present

## 2019-07-24 DIAGNOSIS — R911 Solitary pulmonary nodule: Secondary | ICD-10-CM

## 2019-07-24 DIAGNOSIS — Z853 Personal history of malignant neoplasm of breast: Secondary | ICD-10-CM | POA: Insufficient documentation

## 2019-07-24 DIAGNOSIS — E669 Obesity, unspecified: Secondary | ICD-10-CM | POA: Insufficient documentation

## 2019-07-24 DIAGNOSIS — Z419 Encounter for procedure for purposes other than remedying health state, unspecified: Secondary | ICD-10-CM

## 2019-07-24 HISTORY — PX: VIDEO BRONCHOSCOPY WITH ENDOBRONCHIAL ULTRASOUND: SHX6177

## 2019-07-24 HISTORY — DX: Personal history of other diseases of the digestive system: Z87.19

## 2019-07-24 HISTORY — DX: Pneumonia, unspecified organism: J18.9

## 2019-07-24 HISTORY — PX: VIDEO BRONCHOSCOPY WITH ENDOBRONCHIAL NAVIGATION: SHX6175

## 2019-07-24 HISTORY — DX: Dyspnea, unspecified: R06.00

## 2019-07-24 HISTORY — DX: Other nonspecific abnormal finding of lung field: R91.8

## 2019-07-24 HISTORY — DX: Presence of spectacles and contact lenses: Z97.3

## 2019-07-24 HISTORY — DX: Gastro-esophageal reflux disease without esophagitis: K21.9

## 2019-07-24 HISTORY — DX: Unspecified cataract: H26.9

## 2019-07-24 LAB — COMPREHENSIVE METABOLIC PANEL
ALT: 18 U/L (ref 0–44)
AST: 21 U/L (ref 15–41)
Albumin: 3.5 g/dL (ref 3.5–5.0)
Alkaline Phosphatase: 58 U/L (ref 38–126)
Anion gap: 9 (ref 5–15)
BUN: 16 mg/dL (ref 8–23)
CO2: 24 mmol/L (ref 22–32)
Calcium: 9.1 mg/dL (ref 8.9–10.3)
Chloride: 109 mmol/L (ref 98–111)
Creatinine, Ser: 0.68 mg/dL (ref 0.44–1.00)
GFR calc Af Amer: >60 mL/min (ref >60)
GFR calc non Af Amer: >60 mL/min (ref >60)
Glucose, Bld: 101 mg/dL — ABNORMAL HIGH (ref 70–99)
Potassium: 3.9 mmol/L (ref 3.5–5.1)
Sodium: 142 mmol/L (ref 135–145)
Total Bilirubin: 0.5 mg/dL (ref 0.3–1.2)
Total Protein: 6.3 g/dL — ABNORMAL LOW (ref 6.5–8.1)

## 2019-07-24 LAB — ACID FAST SMEAR (AFB, MYCOBACTERIA)
Acid Fast Smear: NEGATIVE
Acid Fast Smear: NEGATIVE
Acid Fast Smear: NEGATIVE

## 2019-07-24 LAB — CBC
HCT: 41.5 % (ref 36.0–46.0)
Hemoglobin: 13.1 g/dL (ref 12.0–15.0)
MCH: 29.2 pg (ref 26.0–34.0)
MCHC: 31.6 g/dL (ref 30.0–36.0)
MCV: 92.4 fL (ref 80.0–100.0)
Platelets: 214 10*3/uL (ref 150–400)
RBC: 4.49 MIL/uL (ref 3.87–5.11)
RDW: 13.4 % (ref 11.5–15.5)
WBC: 5.9 10*3/uL (ref 4.0–10.5)
nRBC: 0 % (ref 0.0–0.2)

## 2019-07-24 LAB — PROTIME-INR
INR: 1 (ref 0.8–1.2)
Prothrombin Time: 13.3 seconds (ref 11.4–15.2)

## 2019-07-24 LAB — APTT: aPTT: 30 seconds (ref 24–36)

## 2019-07-24 SURGERY — VIDEO BRONCHOSCOPY WITH ENDOBRONCHIAL NAVIGATION
Anesthesia: General | Site: Chest

## 2019-07-24 MED ORDER — SODIUM CHLORIDE 0.9 % IV SOLN
INTRAVENOUS | Status: DC | PRN
  Administered 2019-07-24: 08:00:00 30 ug/min via INTRAVENOUS

## 2019-07-24 MED ORDER — ROCURONIUM BROMIDE 10 MG/ML (PF) SYRINGE
PREFILLED_SYRINGE | INTRAVENOUS | Status: DC | PRN
  Administered 2019-07-24: 50 mg via INTRAVENOUS
  Administered 2019-07-24: 10 mg via INTRAVENOUS

## 2019-07-24 MED ORDER — LIDOCAINE 2% (20 MG/ML) 5 ML SYRINGE
INTRAMUSCULAR | Status: DC | PRN
  Administered 2019-07-24: 80 mg via INTRAVENOUS

## 2019-07-24 MED ORDER — EPINEPHRINE PF 1 MG/ML IJ SOLN
INTRAMUSCULAR | Status: DC | PRN
  Administered 2019-07-24: 1 mg via ENDOTRACHEOPULMONARY

## 2019-07-24 MED ORDER — EPINEPHRINE PF 1 MG/ML IJ SOLN
INTRAMUSCULAR | Status: AC
  Filled 2019-07-24: qty 1

## 2019-07-24 MED ORDER — PROPOFOL 10 MG/ML IV BOLUS
INTRAVENOUS | Status: AC
  Filled 2019-07-24: qty 20

## 2019-07-24 MED ORDER — LACTATED RINGERS IV SOLN
INTRAVENOUS | Status: DC | PRN
  Administered 2019-07-24: 07:00:00 via INTRAVENOUS

## 2019-07-24 MED ORDER — MUPIROCIN 2 % EX OINT: 1.0000 "application " | TOPICAL_OINTMENT | Freq: Once | CUTANEOUS | Status: DC

## 2019-07-24 MED ORDER — FENTANYL CITRATE (PF) 250 MCG/5ML IJ SOLN
INTRAMUSCULAR | Status: AC
  Filled 2019-07-24: qty 5

## 2019-07-24 MED ORDER — 0.9 % SODIUM CHLORIDE (POUR BTL) OPTIME
TOPICAL | Status: DC | PRN
  Administered 2019-07-24: 1000 mL

## 2019-07-24 MED ORDER — CEFAZOLIN SODIUM-DEXTROSE 2-3 GM-%(50ML) IV SOLR
INTRAVENOUS | Status: DC | PRN
  Administered 2019-07-24: 2 g via INTRAVENOUS

## 2019-07-24 MED ORDER — SUGAMMADEX SODIUM 200 MG/2ML IV SOLN
INTRAVENOUS | Status: DC | PRN
  Administered 2019-07-24: 200 mg via INTRAVENOUS

## 2019-07-24 MED ORDER — PROPOFOL 10 MG/ML IV BOLUS
INTRAVENOUS | Status: DC | PRN
  Administered 2019-07-24: 170 mg via INTRAVENOUS

## 2019-07-24 MED ORDER — EPHEDRINE SULFATE-NACL 50-0.9 MG/10ML-% IV SOSY
PREFILLED_SYRINGE | INTRAVENOUS | Status: DC | PRN
  Administered 2019-07-24: 5 mg via INTRAVENOUS

## 2019-07-24 MED ORDER — ONDANSETRON HCL 4 MG/2ML IJ SOLN
INTRAMUSCULAR | Status: DC | PRN
  Administered 2019-07-24: 4 mg via INTRAVENOUS

## 2019-07-24 MED ORDER — DEXAMETHASONE SODIUM PHOSPHATE 10 MG/ML IJ SOLN
INTRAMUSCULAR | Status: DC | PRN
  Administered 2019-07-24: 10 mg via INTRAVENOUS

## 2019-07-24 MED ORDER — FENTANYL CITRATE (PF) 250 MCG/5ML IJ SOLN
INTRAMUSCULAR | Status: DC | PRN
  Administered 2019-07-24 (×3): 50 ug via INTRAVENOUS

## 2019-07-24 SURGICAL SUPPLY — 51 items
ADAPTER BRONCHOSCOPE OLYMPUS (ADAPTER) ×2 IMPLANT
ADAPTER VALVE BIOPSY EBUS (MISCELLANEOUS) IMPLANT
ADPTR VALVE BIOPSY EBUS (MISCELLANEOUS)
BLADE CLIPPER SURG (BLADE) IMPLANT
BRUSH BIOPSY BRONCH 10 SDTNB (MISCELLANEOUS) IMPLANT
BRUSH CYTOL CELLEBRITY 1.5X140 (MISCELLANEOUS) IMPLANT
BRUSH SUPERTRAX BIOPSY (INSTRUMENTS) IMPLANT
BRUSH SUPERTRAX NDL-TIP CYTO (INSTRUMENTS) ×2 IMPLANT
CANISTER SUCT 3000ML PPV (MISCELLANEOUS) ×2 IMPLANT
CHANNEL WORK EXTEND EDGE 180 (KITS) ×2 IMPLANT
CHANNEL WORK EXTEND EDGE 90 (KITS) IMPLANT
CONT SPEC 4OZ CLIKSEAL STRL BL (MISCELLANEOUS) ×6 IMPLANT
COVER BACK TABLE 60X90IN (DRAPES) ×2 IMPLANT
COVER WAND RF STERILE (DRAPES) IMPLANT
FILTER STRAW FLUID ASPIR (MISCELLANEOUS) ×2 IMPLANT
FORCEPS BIOP RJ4 1.8 (CUTTING FORCEPS) IMPLANT
FORCEPS BIOP SUPERTRX PREMAR (INSTRUMENTS) ×4 IMPLANT
FORCEPS RADIAL JAW LRG 4 PULM (INSTRUMENTS) IMPLANT
GAUZE SPONGE 4X4 12PLY STRL (GAUZE/BANDAGES/DRESSINGS) ×2 IMPLANT
GLOVE SURG SIGNA 7.5 PF LTX (GLOVE) ×2 IMPLANT
GOWN STRL REUS W/ TWL XL LVL3 (GOWN DISPOSABLE) ×1 IMPLANT
GOWN STRL REUS W/TWL XL LVL3 (GOWN DISPOSABLE) ×1
KIT CLEAN ENDO COMPLIANCE (KITS) ×4 IMPLANT
KIT PROCEDURE EDGE 180 (KITS) IMPLANT
KIT PROCEDURE EDGE 90 (KITS) IMPLANT
KIT TURNOVER KIT B (KITS) ×2 IMPLANT
MARKER SKIN DUAL TIP RULER LAB (MISCELLANEOUS) ×2 IMPLANT
NEEDLE ASPIRATION VIZISHOT 19G (NEEDLE) IMPLANT
NEEDLE ASPIRATION VIZISHOT 21G (NEEDLE) ×2 IMPLANT
NEEDLE BLUNT 18X1 FOR OR ONLY (NEEDLE) IMPLANT
NEEDLE SUPERTRX PREMARK BIOPSY (NEEDLE) ×2 IMPLANT
NS IRRIG 1000ML POUR BTL (IV SOLUTION) ×2 IMPLANT
OIL SILICONE PENTAX (PARTS (SERVICE/REPAIRS)) ×2 IMPLANT
PAD ARMBOARD 7.5X6 YLW CONV (MISCELLANEOUS) IMPLANT
PATCHES PATIENT (LABEL) ×6 IMPLANT
RADIAL JAW LRG 4 PULMONARY (INSTRUMENTS)
SYR 20ML ECCENTRIC (SYRINGE) ×4 IMPLANT
SYR 20ML LL LF (SYRINGE) ×4 IMPLANT
SYR 30ML LL (SYRINGE) IMPLANT
SYR 3ML LL SCALE MARK (SYRINGE) ×2 IMPLANT
SYR 5ML LL (SYRINGE) IMPLANT
SYR 5ML LUER SLIP (SYRINGE) IMPLANT
TOWEL GREEN STERILE (TOWEL DISPOSABLE) IMPLANT
TOWEL GREEN STERILE FF (TOWEL DISPOSABLE) ×2 IMPLANT
TRAP SPECIMEN MUCOUS 40CC (MISCELLANEOUS) ×2 IMPLANT
TUBE CONNECTING 20X1/4 (TUBING) ×4 IMPLANT
UNDERPAD 30X30 (UNDERPADS AND DIAPERS) ×2 IMPLANT
VALVE BIOPSY  SINGLE USE (MISCELLANEOUS) ×1
VALVE BIOPSY SINGLE USE (MISCELLANEOUS) ×1 IMPLANT
VALVE SUCTION BRONCHIO DISP (MISCELLANEOUS) ×4 IMPLANT
WATER STERILE IRR 1000ML POUR (IV SOLUTION) ×2 IMPLANT

## 2019-07-24 NOTE — Interval H&P Note (Signed)
History and Physical Interval Note:  07/24/2019 7:13 AM  Rebekah Taylor  has presented today for surgery, with the diagnosis of RIGHT UPPER AND LOWER LOBE NODULES POSSIBLE ADENOPATHY.  The various methods of treatment have been discussed with the patient and family. After consideration of risks, benefits and other options for treatment, the patient has consented to  Procedure(s): VIDEO BRONCHOSCOPY WITH ENDOBRONCHIAL NAVIGATION (N/A) VIDEO BRONCHOSCOPY WITH ENDOBRONCHIAL ULTRASOUND (N/A) as a surgical intervention.  The patient's history has been reviewed, patient examined, no change in status, stable for surgery.  I have reviewed the patient's chart and labs.  Questions were answered to the patient's satisfaction.     [name]

## 2019-07-24 NOTE — Discharge Instructions (Addendum)
Do not drive or engage in heavy physical activity for 24 hours  You may resume normal activities tomorrow  You may cough up small amounts of blood over the next few days  You may use acetaminophen (Tylenol) of needed for pain  Call [phone] if you have chest pain, shortness of breath, fever > 101 F or cough up more than 2 tablespoons of blood  My office will contact you with a follow up appointment for this Friday 07/28/2019

## 2019-07-24 NOTE — Anesthesia Procedure Notes (Signed)
Procedure Name: Intubation Date/Time: 07/24/2019 7:40 AM Performed by: Roberts Gaudy, MD Pre-anesthesia Checklist: Patient identified, Emergency Drugs available, Suction available and Patient being monitored Patient Re-evaluated:Patient Re-evaluated prior to induction Oxygen Delivery Method: Circle System Utilized Preoxygenation: Pre-oxygenation with 100% oxygen Induction Type: IV induction Ventilation: Mask ventilation without difficulty Laryngoscope Size: Miller and 2 Grade View: Grade I Tube type: Oral Tube size: 8.5 mm Number of attempts: 1 Airway Equipment and Method: Stylet and Oral airway Placement Confirmation: ETT inserted through vocal cords under direct vision,  positive ETCO2 and breath sounds checked- equal and bilateral Secured at: 20 cm Tube secured with: Tape Dental Injury: Teeth and Oropharynx as per pre-operative assessment  Comments: No change in dentition - front bottom and top teeth reported to be chipped per patient during pre-operative assessment

## 2019-07-24 NOTE — Brief Op Note (Signed)
07/24/2019  9:29 AM  PATIENT:  Rebekah Taylor  73 y.o. female  PRE-OPERATIVE DIAGNOSIS:  RIGHT UPPER AND LOWER LOBE NODULES POSSIBLE ADENOPATHY  POST-OPERATIVE DIAGNOSIS:  RIGHT UPPER AND LOWER LOBE NODULES  PROCEDURE:  Procedure(s): VIDEO BRONCHOSCOPY WITH ENDOBRONCHIAL NAVIGATION (N/A) VIDEO BRONCHOSCOPY WITH ENDOBRONCHIAL ULTRASOUND (N/A) Needle aspirations, brushings and transbronchial biopsies  SURGEON:  Surgeon(s) and Role:    * [name], MD - Primary  PHYSICIAN ASSISTANT:   ASSISTANTS: none   ANESTHESIA:   general  EBL:  minimal  BLOOD ADMINISTERED:none  DRAINS: none   LOCAL MEDICATIONS USED:  NONE  SPECIMEN:  Source of Specimen:  11R node, RLL nodule, RUL nodule  DISPOSITION OF SPECIMEN:  Path and micro  COUNTS:  NO endoscopic  TOURNIQUET:  * No tourniquets in log *  DICTATION: .Other Dictation: Dictation Number -  PLAN OF CARE: Discharge to home after PACU  PATIENT DISPOSITION:  PACU - hemodynamically stable.   Delay start of Pharmacological VTE agent (>24hrs) due to surgical blood loss or risk of bleeding: not applicable

## 2019-07-24 NOTE — Transfer of Care (Signed)
Immediate Anesthesia Transfer of Care Note  Patient: Rebekah Taylor  Procedure(s) Performed: VIDEO BRONCHOSCOPY WITH ENDOBRONCHIAL NAVIGATION (N/A Chest) VIDEO BRONCHOSCOPY WITH ENDOBRONCHIAL ULTRASOUND (N/A Chest)  Patient Location: PACU  Anesthesia Type:General  Level of Consciousness: awake, alert  and oriented  Airway & Oxygen Therapy: Patient Spontanous Breathing  Post-op Assessment: Report given to RN and Post -op Vital signs reviewed and stable  Post vital signs: Reviewed and stable  Last Vitals:  Vitals Value Taken Time  BP 125/55 07/24/19 0939  Temp 36.4 C 07/24/19 0939  Pulse 72 07/24/19 0940  Resp 13 07/24/19 0940  SpO2 95 % 07/24/19 0940  Vitals shown include unvalidated device data.  Last Pain:  Vitals:   07/24/19 0939  TempSrc:   PainSc: 0-No pain      Patients Stated Pain Goal: 0 (24/58/09 9833)  Complications: No apparent anesthesia complications

## 2019-07-24 NOTE — Anesthesia Postprocedure Evaluation (Signed)
Anesthesia Post Note  Patient: Rebekah Taylor  Procedure(s) Performed: VIDEO BRONCHOSCOPY WITH ENDOBRONCHIAL NAVIGATION (N/A Chest) VIDEO BRONCHOSCOPY WITH ENDOBRONCHIAL ULTRASOUND (N/A Chest)     Patient location during evaluation: PACU Anesthesia Type: General Level of consciousness: awake and alert Pain management: pain level controlled Vital Signs Assessment: post-procedure vital signs reviewed and stable Respiratory status: spontaneous breathing, nonlabored ventilation, respiratory function stable and patient connected to nasal cannula oxygen Cardiovascular status: blood pressure returned to baseline and stable Postop Assessment: no apparent nausea or vomiting Anesthetic complications: no    Last Vitals:  Vitals:   07/24/19 0939 07/24/19 0954  BP: (!) 125/55 (!) 127/58  Pulse: 62 70  Resp: 11 15  Temp: 36.4 C 36.6 C  SpO2: 97% 93%    Last Pain:  Vitals:   07/24/19 0954  TempSrc:   PainSc: 0-No pain                 Rebekah Taylor

## 2019-07-24 NOTE — Anesthesia Preprocedure Evaluation (Signed)
Anesthesia Evaluation  Patient identified by MRN, date of birth, ID band Patient awake    Reviewed: Allergy & Precautions, NPO status , Patient's Chart, lab work & pertinent test results  Airway Mallampati: II  TM Distance: >3 FB Neck ROM: Full    Dental  (+) Teeth Intact, Dental Advisory Given   Pulmonary    breath sounds clear to auscultation       Cardiovascular hypertension,  Rhythm:Regular Rate:Normal     Neuro/Psych    GI/Hepatic   Endo/Other    Renal/GU      Musculoskeletal   Abdominal   Peds  Hematology   Anesthesia Other Findings   Reproductive/Obstetrics                             Anesthesia Physical Anesthesia Plan  ASA: II  Anesthesia Plan: General   Post-op Pain Management:    Induction: Intravenous  PONV Risk Score and Plan: Ondansetron and Dexamethasone  Airway Management Planned: Oral ETT  Additional Equipment:   Intra-op Plan:   Post-operative Plan:   Informed Consent: I have reviewed the patients History and Physical, chart, labs and discussed the procedure including the risks, benefits and alternatives for the proposed anesthesia with the patient or authorized representative who has indicated his/her understanding and acceptance.     Dental advisory given  Plan Discussed with: CRNA and Anesthesiologist  Anesthesia Plan Comments:        Anesthesia Quick Evaluation  

## 2019-07-25 ENCOUNTER — Encounter (HOSPITAL_COMMUNITY): Payer: Self-pay | Admitting: Thoracic Surgery (Cardiothoracic Vascular Surgery)

## 2019-07-25 NOTE — Op Note (Signed)
NAMEGRISEL, BLUMENSTOCK MEDICAL RECORD [id number] ACCOUNT [id number] DATE OF BIRTH:April 05, 1946 FACILITY: [facility] LOCATION: [facility] PHYSICIAN:Asahd Can C. Jeren Dufrane, MD  OPERATIVE REPORT  DATE OF PROCEDURE:  07/24/2019  PREOPERATIVE DIAGNOSIS:  Right upper and lower lobe nodules and hilar adenopathy.  POSTOPERATIVE DIAGNOSIS:  Right upper and lower lobe nodules and hilar adenopathy.  PROCEDURE:   1.Electromagnetic navigational bronchoscopy with needle aspirations, brushings and transbronchial biopsies and  2.Endobronchial ultrasound with hilar lymph node aspiration.  SURGEON:  Modesto Charon, MD  ASSISTANT:  None.  ANESTHESIA:  General.  FINDINGS:  Aspiration of 11R node showed lymphoid cells, but no tumor was seen.  Vague granulomatous inflammation on brushings of lower lobe and upper lobe nodules.  CLINICAL NOTE:  This is a 73 year old woman who is a lifelong nonsmoker.  She presented with a chronic cough.  Workup showed a right upper lobe opacity measuring 3.4 x 1.6 x 4.2 cm as well as a 7-8 mm nodule in the right lower lobe.  There were multiple  small nodules along the major fissure and borderline hilar adenopathy.  QuantiFERON Gold and fungal testing were negative.  PET CT showed the upper lobe mass was hypermetabolic as was the lower lobe mass and the hilar node.  She was advised to undergo  navigational bronchoscopy and endobronchial ultrasound for diagnostic and staging purposes.  The indications, risks, benefits, and alternatives were discussed in detail with the patient.  She understood and accepted the risks and agreed to proceed.  DESCRIPTION OF PROCEDURE:  The patient was brought to the operating room on 07/24/2019.  She had induction of general anesthesia and was intubated.  A timeout was performed.  Flexible fiberoptic bronchoscopy was performed via the endotracheal tube.  It  revealed normal endobronchial anatomy with no endobronchial lesions to the level of the  subsegmental bronchi.  The endobronchial ultrasound scope was placed.  It was advanced.  Inspection in the subcarinal area showed the esophagus, but no lymph nodes.  No nodes were seen in the 4R region either.  Just distal to the takeoff of the right upper lobe bronchus, there  was an 11R node that correlated with the node seen on CT in the area of hypermetabolism seen on PET.  Multiple aspirations were performed from this node with ultrasound visualization.  Aspirations were performed both with and without suction applied.   Quick prep showed lymphoid cells, but no tumor was seen.  There was some minor bleeding with the needle aspirations which resolved with saline irrigation.  The bronchoscope was replaced.  The locatable guide for navigation was placed through the bronchoscope and registration was performed.  The scope then was directed to the right lower lobe bronchus and the appropriate segmental bronchus was cannulated  with the locatable guide, which was advanced to within 8 mm of the center of the lesion with good alignment.  All sampling of the lung nodules was done with fluoroscopy.  Three needle aspirations were performed.  After every third sample or if there was  any indication the sheath moved fluoroscopy, the locatable guide was replaced to ensure continued proximity and alignment with the nodule.  Three needle brushings were performed also.  After that was done, multiple biopsies were obtained.  Two fragments  from the biopsies were sent for AFB and fungal cultures.  The remainder was sent for permanent pathology.  Quick prep on the needle aspirations and brushings showed some vague inflammation, possible granulomatous in nature, but no tumor cells were seen.  The bronchoscope then was  directed to the right upper lobe bronchus and the locatable guide was advanced to within a centimeter of the center of the lesion in the right upper lobe.  The sampling process was repeated again using  fluoroscopy.  Needle  aspirations, brushings and transbronchial biopsies were performed.  Again, biopsies were sent for both AFB and fungal cultures as well as permanent pathology.  The quick prep on these lesions also showed some inflammatory changes, but no definite tumor  cells were seen.  Again, there was some bleeding with these transbronchial biopsies, but that cleared with saline.  The total fluoroscopy time was 4.9 minutes with a total dose of 71.6 mGy.  The final inspection was made with the bronchoscope.  There was  no ongoing bleeding.  The patient was extubated in the operating room and taken to the [facility] in good condition.  TN/NUANCE  D:07/25/2019 T:07/25/2019 JOB:[id number]

## 2019-07-26 ENCOUNTER — Encounter: Payer: TRICARE For Life (TFL) | Admitting: Thoracic Surgery (Cardiothoracic Vascular Surgery)

## 2019-07-27 ENCOUNTER — Other Ambulatory Visit: Payer: Self-pay | Admitting: *Deleted

## 2019-07-27 NOTE — Progress Notes (Signed)
The proposed treatment discussed in cancer conference 07/27/2019 is for discussion purpose only and is not a binding recommendation.  The patient was not physically examined nor present for their treatment options.  Therefore, final treatment plans cannot be decided.  

## 2019-07-28 ENCOUNTER — Ambulatory Visit (INDEPENDENT_AMBULATORY_CARE_PROVIDER_SITE_OTHER): Payer: Medicare Other | Admitting: Thoracic Surgery (Cardiothoracic Vascular Surgery)

## 2019-07-28 ENCOUNTER — Encounter: Payer: Self-pay | Admitting: Thoracic Surgery (Cardiothoracic Vascular Surgery)

## 2019-07-28 ENCOUNTER — Other Ambulatory Visit: Payer: Self-pay

## 2019-07-28 VITALS — BP 180/76 | HR 66 | Temp 97.7°F | Resp 20 | Ht 67.0 in | Wt 178.0 lb

## 2019-07-28 DIAGNOSIS — Z9889 Other specified postprocedural states: Secondary | ICD-10-CM | POA: Diagnosis not present

## 2019-07-28 DIAGNOSIS — R918 Other nonspecific abnormal finding of lung field: Secondary | ICD-10-CM | POA: Diagnosis not present

## 2019-07-28 DIAGNOSIS — R599 Enlarged lymph nodes, unspecified: Secondary | ICD-10-CM

## 2019-07-28 DIAGNOSIS — R591 Generalized enlarged lymph nodes: Secondary | ICD-10-CM

## 2019-07-28 NOTE — Progress Notes (Signed)
[address]       [place] [address]             [phone]       HPI: Rebekah Taylor returns to discuss the results of her bronchoscopy and endobronchial ultrasound  Rebekah Taylor is a 73 year old non-smoker with a past medical history significant for hypertension, nonmelanoma skin cancer, breast cancer, obesity, arthritis, and reflux.  She presented with a chronic cough.  She was treated presumptively for pneumonia.  A chest x-ray showed a right upper lobe opacity and a CT of the chest showed a 3.4 x 1.6 x 4.2 cm mass in the right upper lobe along with a 7 to 8 mm nodule in the right lower lobe.  There are also multiple small nodules along the major fissure and some borderline hilar adenopathy.  On PET CT the upper lobe and lower lobe nodules and peribronchial lymph nodes all were hypermetabolic.  I did a bronchoscopy and endobronchial ultrasound.  Aspirations from the parabronchial node were positive for lymphoid cells, negative for tumor cells.  Brushings and biopsies from the lower lobe and upper lobe nodules showed granulomas.  There was no evidence of malignancy.  She tolerated the procedure well.  She has been clearing her throat a lot and has been coughing frequently although that is getting better over the last couple of days.  She does NOT have a history of melanoma Current Outpatient Medications  Medication Sig Dispense Refill  . b complex vitamins tablet Take 1 tablet by mouth daily.    . cetirizine (ZYRTEC) 10 MG tablet Take 10 mg by mouth every evening.     . cholecalciferol (VITAMIN D3) 25 MCG (1000 UT) tablet Take 1,000 Units by mouth daily.    . famotidine (PEPCID) 20 MG tablet Take 20 mg by mouth daily.     . fluticasone (FLONASE) 50 MCG/ACT nasal spray Place 1 spray into both nostrils daily.    . hydrochlorothiazide (HYDRODIURIL) 12.5 MG tablet Take 12.5 mg by mouth daily.     . montelukast (SINGULAIR) 10 MG tablet Take 10 mg by mouth every evening.      Marland Kitchen ZINC-VITAMIN C PO Take 1 tablet by mouth daily.     No current facility-administered medications for this visit.     Physical Exam BP (!) 180/76   Pulse 66   Temp 97.7 F (36.5 C) (Skin)   Resp 20   Ht 5\' 7"  (1.702 m)   Wt 178 lb (80.7 kg)   SpO2 94% Comment: RA  BMI 27.8 kg/m  73 year old woman in no acute distress Alert and oriented x3 with no focal deficits Wearing surgical mask Lungs clear with equal breath sounds bilaterally Cardiac regular rate and rhythm  Diagnostic Tests: Diagnosis 1. Lung, biopsy, Right lower lobe nodule - BENIGN BRONCHIAL AND ALVEOLAR TISSUE. 2. Lung, biopsy, Right upper - POORLY FORMED GRANULOMA IN OTHERWISE BENIGN BRONCHIAL AND ALVEOLAR TISSUE. Microscopic Comment  Impression: Rebekah Taylor is a 73 year old woman with a history of hypertension, obesity, arthritis, reflux, breast cancer, and basal cell carcinoma.  She is a lifelong non-smoker.  She presented with a cough last fall.  She was treated empirically for pneumonia.  Her cough is persisted.  On CT she has a 3.4 x 1.6 x 4.2 cm mass that is hypermetabolic on PET/CT.    I did bronchoscopy and endobronchial ultrasound mostly to clear the lower lobe nodule and parabronchial node so that we could consider right  upper lobectomy.  Samples from those areas did not show any evidence of malignancy.  There was some granulomatous disease in the lower lobe nodule.  The lymph node aspiration showed lymphoid cells but no tumor was seen.  Biopsies from the upper lobe nodule also showed granulomatous disease.  I went over these results with Mr. and Mrs. Krebbs.  They understand this does not completely rule out the possibility of underlying malignancy.  We discussed what to do regarding the upper lobe mass/infiltrate.  I think given the presence of granulomas we should wait until the final AFB and fungal culture results are back.  The stains were negative but she could still grow out a atypical  Mycobacterium and culture.  That usually takes around 4 to 6 weeks to get a final result.  So I will plan to see her back at that time to discuss whether to proceed with surgical resection or antibiotic treatment.  She knows to contact us if she were to start experiencing fevers or chills or have increased cough or hemoptysis.  Plan: Return in 6 weeks with PA lateral chest x-ray  [name], MD [facility] [phone]

## 2019-08-14 LAB — CULTURE, FUNGUS WITHOUT SMEAR

## 2019-09-05 ENCOUNTER — Encounter: Payer: Medicare Other | Admitting: Thoracic Surgery (Cardiothoracic Vascular Surgery)

## 2019-09-05 LAB — ACID FAST CULTURE WITH REFLEXED SENSITIVITIES (MYCOBACTERIA)
Acid Fast Culture: NEGATIVE
Acid Fast Culture: NEGATIVE
Acid Fast Culture: NEGATIVE

## 2019-09-11 ENCOUNTER — Other Ambulatory Visit: Payer: Self-pay | Admitting: Thoracic Surgery (Cardiothoracic Vascular Surgery)

## 2019-09-11 DIAGNOSIS — R918 Other nonspecific abnormal finding of lung field: Secondary | ICD-10-CM

## 2019-09-12 ENCOUNTER — Ambulatory Visit
Admission: RE | Admit: 2019-09-12 | Discharge: 2019-09-12 | Disposition: A | Discharge status: Home / Self Care | Payer: Medicare Other | Source: Ambulatory Visit | Attending: Thoracic Surgery (Cardiothoracic Vascular Surgery) | Admitting: Thoracic Surgery (Cardiothoracic Vascular Surgery)

## 2019-09-12 ENCOUNTER — Other Ambulatory Visit: Payer: Self-pay | Admitting: Thoracic Surgery (Cardiothoracic Vascular Surgery)

## 2019-09-12 ENCOUNTER — Encounter: Payer: Self-pay | Admitting: Thoracic Surgery (Cardiothoracic Vascular Surgery)

## 2019-09-12 ENCOUNTER — Ambulatory Visit (INDEPENDENT_AMBULATORY_CARE_PROVIDER_SITE_OTHER): Payer: Medicare Other | Admitting: Thoracic Surgery (Cardiothoracic Vascular Surgery)

## 2019-09-12 ENCOUNTER — Other Ambulatory Visit: Payer: Self-pay

## 2019-09-12 VITALS — BP 162/77 | HR 56 | Temp 97.5°F | Resp 18 | Ht 67.0 in | Wt 174.8 lb

## 2019-09-12 DIAGNOSIS — Z9889 Other specified postprocedural states: Secondary | ICD-10-CM

## 2019-09-12 DIAGNOSIS — R591 Generalized enlarged lymph nodes: Secondary | ICD-10-CM | POA: Diagnosis not present

## 2019-09-12 DIAGNOSIS — R918 Other nonspecific abnormal finding of lung field: Secondary | ICD-10-CM | POA: Diagnosis not present

## 2019-09-12 DIAGNOSIS — Z853 Personal history of malignant neoplasm of breast: Secondary | ICD-10-CM | POA: Diagnosis not present

## 2019-09-12 DIAGNOSIS — R599 Enlarged lymph nodes, unspecified: Secondary | ICD-10-CM

## 2019-09-12 DIAGNOSIS — R911 Solitary pulmonary nodule: Secondary | ICD-10-CM

## 2019-09-12 NOTE — Progress Notes (Signed)
[address]       [place] [address]             [phone]     HPI: [name] returns for a scheduled follow-up  Rebekah Taylor is a 73 year old woman with a history of hypertension, nonmelanoma skin cancer, breast cancer, obesity, arthritis, and reflux.  She presented with a chronic cough.  She was treated presumptively for pneumonia.  On chest x-ray she had a right upper lobe opacity.  CT of the chest showed a 3.4 x 1.6 x 4.2 cm mass in the right upper lobe.  There also was an 8 mm nodule in the right middle lobe and multiple small nodules along the major fissure.  On PET CT the upper and lower lobe nodules were hypermetabolic as were some peribronchial lymph nodes.  I did bronchoscopy and endobronchial ultrasound on 07/24/2019.  Aspirations of the lymph node showed lymphoid cells but no tumor cells.  Brushings and biopsies from the nodule showed granulomas.  No malignancy was seen.  She was last seen in the office on 07/28/2019.  Since that time she has been feeling better.  Her cough is unchanged, although her husband says he hears her cough less frequently.  She is not having any fevers, chills, or night sweats.  She is not had any weight loss.  Past Medical History:  Diagnosis Date  . Allergies   . Arthritis   . Breast cancer (HCC)    cervical  . Dyspnea    with exertion  . Early cataracts, bilateral   . GERD (gastroesophageal reflux disease)   . History of hiatal hernia   . Hypertension   . Lung nodules    right upper and lower lobe nodules  . Obesity   . Pneumonia   . Skin cancer    Basal cell forehead  . Wears glasses     Current Outpatient Medications  Medication Sig Dispense Refill  . b complex vitamins tablet Take 1 tablet by mouth daily.    . cetirizine (ZYRTEC) 10 MG tablet Take 10 mg by mouth every evening.     . cholecalciferol (VITAMIN D3) 25 MCG (1000 UT) tablet Take 1,000 Units by mouth daily.    . famotidine (PEPCID) 20 MG tablet  Take 20 mg by mouth daily.     . fluticasone (FLONASE) 50 MCG/ACT nasal spray Place 1 spray into both nostrils daily.    . hydrochlorothiazide (HYDRODIURIL) 12.5 MG tablet Take 12.5 mg by mouth daily.     Marland Kitchen ibuprofen (ADVIL) 200 MG tablet Take 200 mg by mouth every 6 (six) hours as needed.    . montelukast (SINGULAIR) 10 MG tablet Take 10 mg by mouth every evening.     Marland Kitchen omeprazole (PRILOSEC) 20 MG capsule Take 20 mg by mouth daily.    Marland Kitchen ZINC-VITAMIN C PO Take 1 tablet by mouth daily.     No current facility-administered medications for this visit.     Physical Exam BP (!) 162/77 (BP Location: Right Arm, Patient Position: Sitting, Cuff Size: Normal)   Pulse (!) 56   Temp (!) 97.5 F (36.4 C)   Resp 18   Ht 5\' 7"  (1.702 m)   Wt 174 lb 12.8 oz (79.3 kg)   SpO2 95% Comment: RA  BMI 27.72 kg/m  73 year old woman in no acute distress Obese Alert and oriented x3 with no focal deficits Lungs clear with no rales or wheezing Cardiac regular rate and  rhythm  Diagnostic Tests: CHEST - 2 VIEW  COMPARISON:  July 24, 2019  FINDINGS: The technique very significantly between today's study and the previous study. The nodule in the right upper lobe persists but is less conspicuous. The difference in concert q.i.d. is favored to be due to difference in technique rather than a true decrease in size. No other changes.  IMPRESSION: Technique is very different between today's study and the comparison study. The nodule in the right upper lobe is less conspicuous but does persist. The difference in appearance is favored to be technical in nature rather than a true change.   Electronically Signed   By: Dorise Bullion III M.D   On: 09/12/2019 11:15 I personally reviewed the chest x-ray and concur with the findings noted above.  QuantiFERON gold and fungal antigen testing negative.  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  She presented with a cough.  Her work-up included a chest x-ray and then a CT of the chest which showed a right upper lobe mass.  There also was a right lower lobe nodule and multiple nodules along her fissure.  On PET CT the upper and lower lobe nodules were hypermetabolic.  She also had some hypermetabolic peribronchial lymph nodes.  Findings were worrisome for stage III lung cancer.  I did bronchoscopy and endobronchial ultrasound.  No malignancy was seen.  There was some granulomatous inflammation.  Mycobacterial and fungal cultures are negative as are the blood tests.  Chest x-ray today may show a decrease in size of the right upper lobe nodule.  It may be technique related as noted by the radiologist.  There has been no progression.  At this point I will think there is anything to really treat empirically.  AFB and fungal cultures are negative.  This could be sarcoidosis, although she is a little old for initial presentation.  We still have not completely ruled out the possibility of malignancy and I think we need to keep that in mind.  My recommendation to her was that we repeat a CT scan in 6 weeks.  That would be 3 months from her most recent CT scan.  That would give [place] a chance to better assess whether there is been any regression or progression of the lesion.  She knows to call if her symptoms worsen in the interim.  Plan: Return in 6 months with CT chest (3 months from last scan)  [name], MD [facility] [phone]

## 2019-10-19 IMAGING — PT NUCLEAR MEDICINE PET IMAGE INITIAL (PI) SKULL BASE TO THIGH
8 series · 16 of 16 positions shown · non-contrast
Comparison: PET-CT 02/16/2013.

CLINICAL DATA: Initial treatment strategy for lung nodule. History
of malignant melanoma right shoulder.

EXAM:
NUCLEAR MEDICINE PET SKULL BASE TO THIGH
TECHNIQUE: 10.1 mCi F-18 FDG was injected intravenously. Full-ring PET imaging
was performed from the skull base to thigh after the radiotracer. CT
data was obtained and used for attenuation correction and anatomic
localization.
Fasting blood glucose: 78 mg/dl

[Series 3: pet sk_thigh ac · axial · 5.0mm · 4.07mm/px · z∈[-1250,-366]mm · 3 of 222 slices shown]
[im 1/222]
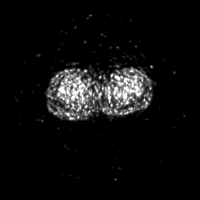
[im 111/222]
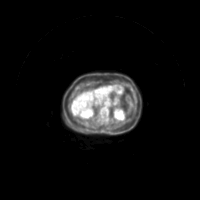
[im 222/222]
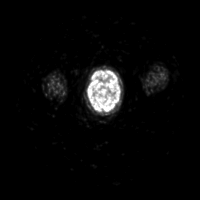

[Series 4: ct sk_thigh 5.0 b31f · axial · 0.98mm/px · z∈[-1250,-366]mm · 3 of 222 slices shown]
[im 1/222  soft-tissue]
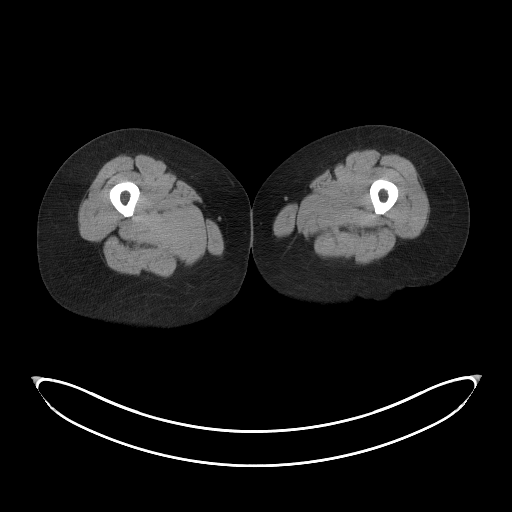
[im 111/222  soft-tissue]
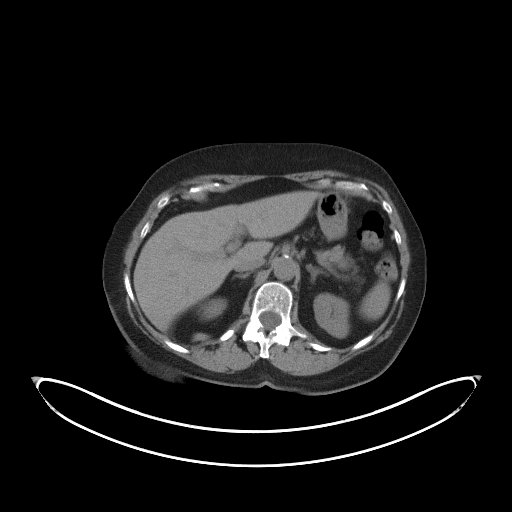
[im 222/222  soft-tissue]
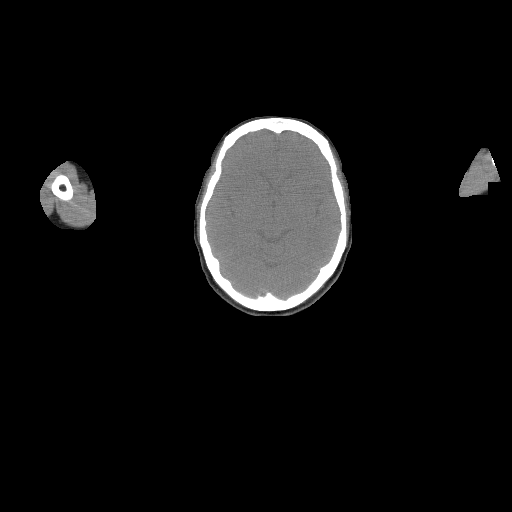

[Series 5: pet sk_thigh nac · axial · 5.0mm · 4.07mm/px · z∈[-1250,-366]mm · 3 of 222 slices shown]
[im 1/222]
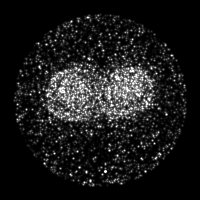
[im 111/222]
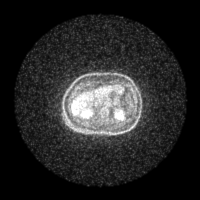
[im 222/222]
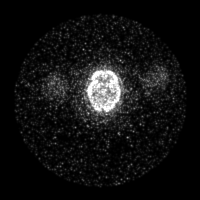

[Series 8: ct sk_thigh 5.0 b70f (id)_bone · axial · 0.66mm/px · 1 of 64 slices shown]
[im 1/64  soft-tissue]
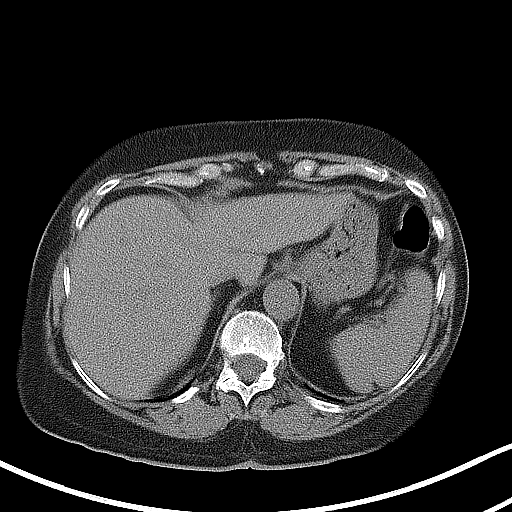

[Series 605: mip range · coronal · 1.83mm/px · 1 of 32 slices shown]
[im 1/32]
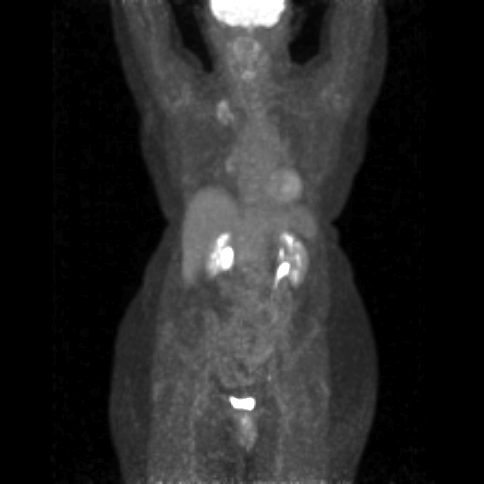

[Series 606: range-ct sk_thigh 5.0 (id)<alpha range> · 1 of 85 slices shown (1 of 2)]
[im 1/85]
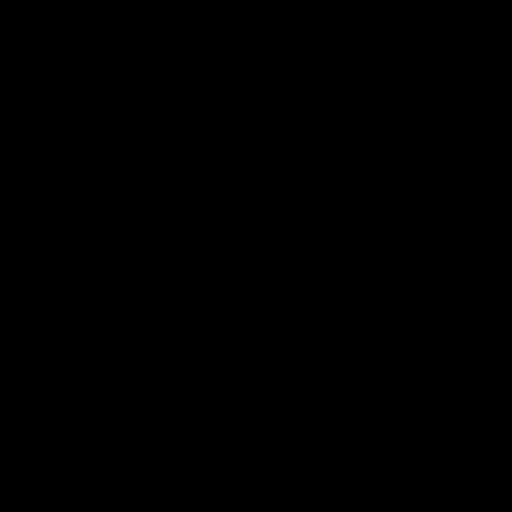

[Series 607: range-ct sk_thigh 5.0 (id)<alpha range> · 3 of 210 slices shown (2 of 2)]
[im 1/210]
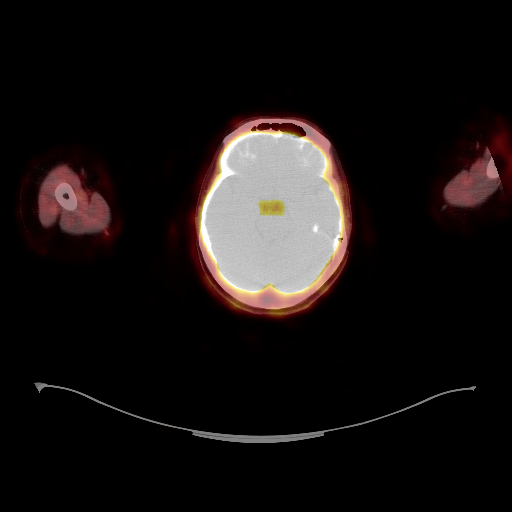
[im 105/210]
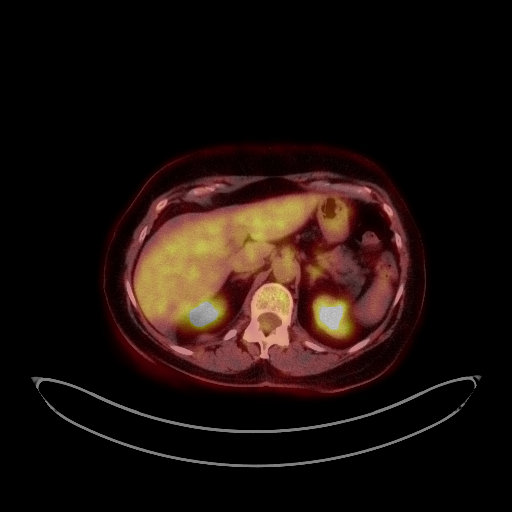
[im 210/210]
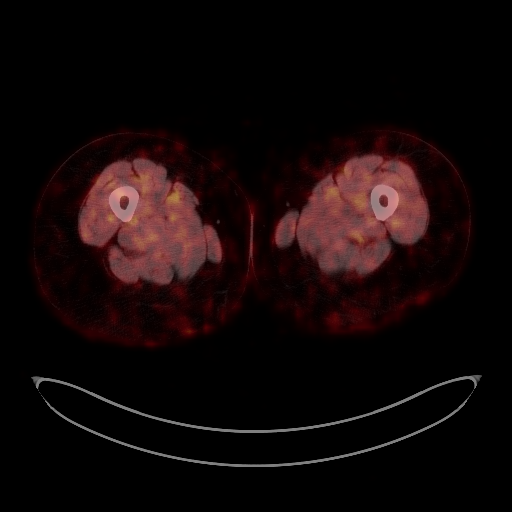

[Series 1062: results mm oncology reading · 1.0mm · 0.89mm/px · 1 of 5 slices shown]
[im 1/5]
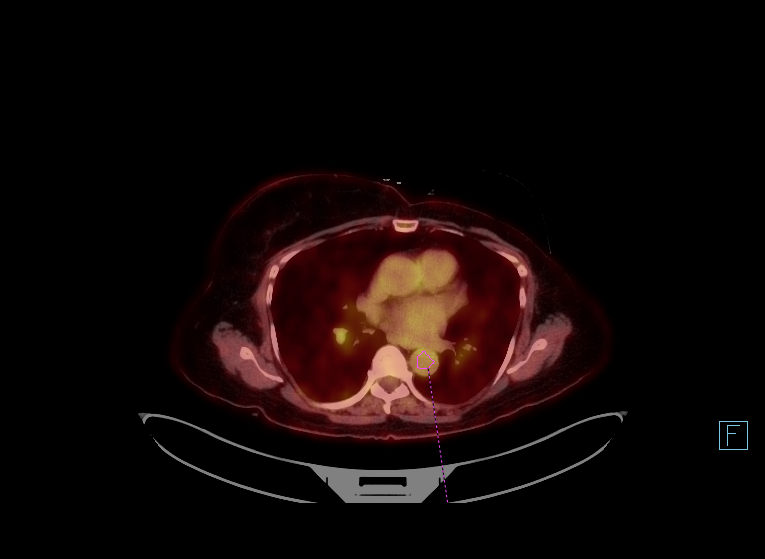

[16 of 16 positions shown; findings below may reference images not displayed]

FINDINGS: Mediastinal blood pool activity: SUV max

Liver activity: SUV max NA

NECK: No hypermetabolic lymph nodes in the neck.

Incidental CT findings: 9 mm nodule left thyroid lobe noted without
hypermetabolism.

CHEST: No hypermetabolic mediastinal or left hilar lymphadenopathy.
No hypermetabolic axillary lymphadenopathy focus of hypermetabolism
identified in the inferior right hilar region, adjacent to the right
lower lobe bronchus. SUV max = 4.2. No pulmonary nodule or lymph
node evident on non breath hold noncontrast CT.

[DATE] x 1.6 x 4.2 cm irregular right upper lobe lesion is
hypermetabolic with SUV max = 6.2.

A second 9 mm right lower lobe nodule (42/8) shows low level FDG
accumulation with SUV max = 2.2.

Several nodules are seen along the major fissure of the right lung,
measuring up to 5 mm (36/8). No discernible hypermetabolism.

Atelectasis or scarring noted in the posterior lingula. No pleural
effusion.

Incidental CT findings: Atherosclerotic calcification is noted in
the wall of the thoracic aorta.

ABDOMEN/PELVIS: No abnormal hypermetabolic activity within the
liver, pancreas, adrenal glands, or spleen. No hypermetabolic lymph
nodes in the abdomen or pelvis.

Incidental CT findings: Tiny calcified gallstones evident. There is
abdominal aortic atherosclerosis without aneurysm.

SKELETON: No focal hypermetabolic activity to suggest skeletal
metastasis.

Incidental CT findings: none
IMPRESSION: 1. 3 x 1.6 x 4.2 cm irregular lesion in the right upper lobe is
hypermetabolic, concerning for neoplasm. Primary bronchogenic
neoplasm a distinct concern. Given the history of malignant
melanoma, metastatic disease is considered less likely but not
excluded.
2. 9 mm nodule right lower lobe with discernible FDG accumulation.
This is not well seen given the non breath hold CT images. Although
FDG uptake is low level, given the small size of this nodule,
neoplasm is a concern. Synchronous primary and metastatic disease
would both be considerations.
3. Focus of hypermetabolism in the inferior right hilar region
without underlying pulmonary nodule or lymph node evident. CT chest
with contrast may prove helpful to further assess.
4. 9 mm left thyroid nodule without hypermetabolism.
5. Cholelithiasis.

## 2019-10-24 ENCOUNTER — Ambulatory Visit: Payer: Medicare Other | Admitting: Thoracic Surgery (Cardiothoracic Vascular Surgery)

## 2019-10-24 ENCOUNTER — Other Ambulatory Visit: Payer: Medicare Other

## 2019-10-31 ENCOUNTER — Other Ambulatory Visit: Payer: Self-pay

## 2019-10-31 ENCOUNTER — Ambulatory Visit (INDEPENDENT_AMBULATORY_CARE_PROVIDER_SITE_OTHER): Payer: Medicare Other | Admitting: Thoracic Surgery (Cardiothoracic Vascular Surgery)

## 2019-10-31 ENCOUNTER — Ambulatory Visit
Admission: RE | Admit: 2019-10-31 | Discharge: 2019-10-31 | Disposition: A | Discharge status: Home / Self Care | Payer: Medicare Other | Source: Ambulatory Visit | Attending: Thoracic Surgery (Cardiothoracic Vascular Surgery) | Admitting: Thoracic Surgery (Cardiothoracic Vascular Surgery)

## 2019-10-31 VITALS — BP 152/81 | HR 54 | Temp 97.0°F | Resp 16 | Ht 67.5 in | Wt 177.4 lb

## 2019-10-31 DIAGNOSIS — R911 Solitary pulmonary nodule: Secondary | ICD-10-CM

## 2019-10-31 NOTE — Progress Notes (Signed)
[address]       [place] [address]             [phone]      HPI: Ms. 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  She presented with a chronic cough.  She was treated empirically for pneumonia but her cough persisted.  A chest x-ray showed a right upper lobe opacity.  CT of the chest showed a 3.4 x 1.6 x 4.2 cm opacity in the right upper lobe.  This was accompanied by an 8 mm nodule in the right middle lobe and multiple small nodules along the major fissure.  On PET CT the nodules were hypermetabolic as were some peribronchial lymph nodes.  I did navigational bronchoscopy and endobronchial ultrasound on 07/24/2019.  No malignancy was seen on any of the specimens.  Brushings and biopsies showed granulomas.  AFB and fungal stains and cultures were negative.  She now returns for interval follow-up 3 months after her previous CT.  She says she has been feeling about the same.  She still has a cough.  No fevers or chills.  She did admit to some night sweats but says she is had these for several years now.  Past Medical History:  Diagnosis Date  . Allergies   . Arthritis   . Breast cancer (HCC)    cervical  . Dyspnea    with exertion  . Early cataracts, bilateral   . GERD (gastroesophageal reflux disease)   . History of hiatal hernia   . Hypertension   . Lung nodules    right upper and lower lobe nodules  . Obesity   . Pneumonia   . Skin cancer    Basal cell forehead  . Wears glasses      Current Outpatient Medications  Medication Sig Dispense Refill  . b complex vitamins tablet Take 1 tablet by mouth daily.    . cetirizine (ZYRTEC) 10 MG tablet Take 10 mg by mouth every evening.     . cholecalciferol (VITAMIN D3) 25 MCG (1000 UT) tablet Take 1,000 Units by mouth daily.    . famotidine (PEPCID) 20 MG  tablet Take 20 mg by mouth daily.     . fluticasone (FLONASE) 50 MCG/ACT nasal spray Place 1 spray into both nostrils daily.    . hydrochlorothiazide (HYDRODIURIL) 12.5 MG tablet Take 12.5 mg by mouth daily.     Marland Kitchen ibuprofen (ADVIL) 200 MG tablet Take 200 mg by mouth every 6 (six) hours as needed.    . meloxicam (MOBIC) 7.5 MG tablet Take 7.5 mg by mouth 2 (two) times daily. Pt taking approximately 1 tab q 3 days d/t GI upset    . montelukast (SINGULAIR) 10 MG tablet Take 10 mg by mouth every evening.     Marland Kitchen omeprazole (PRILOSEC) 20 MG capsule Take 20 mg by mouth daily.    Marland Kitchen ZINC-VITAMIN C PO Take 1 tablet by mouth daily.     No current facility-administered medications for this visit.     Physical Exam BP (!) 152/81 (BP Location: Right Arm, Patient Position: Sitting)   Pulse (!) 54   Temp (!) 97 F (36.1 C) (Skin)   Resp 16   Ht 5' 7.5" (1.715 m)   Wt 177 lb 6.4 oz (80.5 kg)   SpO2 93% Comment: RA  BMI 27.37 kg/m  73 year old woman in no acute distress Alert and oriented x3 with no focal deficits Lungs clear with equal breath sounds bilaterally, no rales or wheezing Cardiac regular rate and rhythm normal S1 and S2  Diagnostic Tests: CT CHEST WITHOUT CONTRAST  TECHNIQUE: Multidetector CT imaging of the chest was performed following the standard protocol without IV contrast.  COMPARISON:  07/21/2019  FINDINGS: Cardiovascular: No significant vascular findings. Normal heart size. No pericardial effusion. Aortic atherosclerosis.  Mediastinum/Nodes: No enlarged mediastinal or axillary lymph nodes. Thyroid gland, trachea, and esophagus demonstrate no significant findings.  Lungs/Pleura: No pleural effusion, airspace consolidation or atelectasis noted. The right upper lobe lung lesion is again noted using the same measurements scheme as on the previous exam this measures 2.9 x 1.3 by 4.9 cm (volume = 9.7 cm^3), image 22/8. This is compared with 3.4 x 1.2 by 4.8 cm (volume  = 10 cm^3)previously.  Upper Abdomen: No acute abnormality.  Musculoskeletal: No chest wall mass or suspicious bone lesions identified.  IMPRESSION: 1. No significant change in size of dominant, FDG avid right upper lobe lung lesion which on previous PET-CT had an SUV max of 6.2. 2. Stable small perifissural nodules in the right lower lobe. 3. No new or progressive findings identified.  Aortic Atherosclerosis (ICD10-I70.0).   Electronically Signed   By: Kerby Moors M.D.   On: 10/31/2019 12:51 I personally reviewed the CT images and concur with the findings noted above  Impression: Domnique Raysor is a 73 year old woman who is a lifelong non-smoker who presented with a chronic cough.  Her past medical history is significant for hypertension, nonmelanoma skin cancer, breast cancer, reflux, obesity, and arthritis.  Chest x-ray showed a right upper lobe lung nodule which was confirmed by a CT of the chest.  On PET CT the nodule was active with an SUV max of 6.2.  Navigational bronchoscopy and endobronchial ultrasound showed no evidence of malignancy.  There were granulomas noted.  AFB and fungal stains and cultures were negative.  Differential diagnosis includes atypical infections, sarcoidosis, and cancer.  She is aware that negative biopsies do not completely rule out the possibility of cancer and that is why we cannot just write this off completely.  The appearance of the lesions does fit better with an infectious or inflammatory etiology.  Unfortunately with negative cultures and serologic testing we really do not have anything to treat empirically.  We discussed the options of repeat bronchoscopy, surgical resection, and continue radiographic follow-up.  She understands the pros and cons of each of those approaches.  She elects to continue with radiographic follow-up for now.  Plan: Return in 3 months with CT chest  [name], MD [facility]  [phone]

## 2019-12-26 ENCOUNTER — Other Ambulatory Visit: Payer: Self-pay | Admitting: Thoracic Surgery (Cardiothoracic Vascular Surgery)

## 2019-12-26 DIAGNOSIS — R911 Solitary pulmonary nodule: Secondary | ICD-10-CM

## 2020-01-30 ENCOUNTER — Ambulatory Visit: Payer: Medicare Other | Admitting: Thoracic Surgery (Cardiothoracic Vascular Surgery)

## 2020-01-30 ENCOUNTER — Other Ambulatory Visit: Payer: Medicare Other

## 2020-02-06 ENCOUNTER — Encounter: Payer: Self-pay | Admitting: Thoracic Surgery (Cardiothoracic Vascular Surgery)

## 2020-02-06 ENCOUNTER — Ambulatory Visit (INDEPENDENT_AMBULATORY_CARE_PROVIDER_SITE_OTHER): Payer: Medicare Other | Admitting: Thoracic Surgery (Cardiothoracic Vascular Surgery)

## 2020-02-06 ENCOUNTER — Other Ambulatory Visit: Payer: Self-pay

## 2020-02-06 ENCOUNTER — Ambulatory Visit
Admission: RE | Admit: 2020-02-06 | Discharge: 2020-02-06 | Disposition: A | Discharge status: Home / Self Care | Payer: Medicare Other | Source: Ambulatory Visit | Attending: Thoracic Surgery (Cardiothoracic Vascular Surgery) | Admitting: Thoracic Surgery (Cardiothoracic Vascular Surgery)

## 2020-02-06 VITALS — BP 139/82 | HR 69 | Temp 97.5°F | Resp 16 | Ht 67.0 in | Wt 180.0 lb

## 2020-02-06 DIAGNOSIS — R911 Solitary pulmonary nodule: Secondary | ICD-10-CM

## 2020-02-06 DIAGNOSIS — D381 Neoplasm of uncertain behavior of trachea, bronchus and lung: Secondary | ICD-10-CM

## 2020-02-06 NOTE — Progress Notes (Signed)
[address]       [place] [address]             [phone]     HPI: Mrs. Escandon returns for a scheduled follow-up regarding her right upper lobe lung nodule.  Keliana Sweeley is a 74 year old woman with a history of hypertension, nonmelanoma skin cancer, breast cancer, obesity, arthritis, and reflux.  She initially presented with a chronic cough.  She was treated empirically for pneumonia.  A chest x-ray showed a right upper lobe opacity.  On CT of the chest there was a 3.4 x 1.6 x 4.2 cm opacity in the right upper lobe.  Also was an 8 mm right middle lobe nodule and multiple small nodules along the fissure.  PET/CT showed the nodule was hypermetabolic.  I did navigational bronchoscopy in August 2020.  Brushings and biopsies showed granulomas.  AFB and fungal cultures were negative.  She has been followed since then.  I last saw her in November.  She still complained of a cough.  Her CT was not significantly changed.  In the interim since her last visit she still has a cough.  She denies any fevers or chills.  Her cough is sometimes productive with a small amount of clear sputum.  Past Medical History:  Diagnosis Date  . Allergies   . Arthritis   . Breast cancer (HCC)    cervical  . Dyspnea    with exertion  . Early cataracts, bilateral   . GERD (gastroesophageal reflux disease)   . History of hiatal hernia   . Hypertension   . Lung nodules    right upper and lower lobe nodules  . Obesity   . Pneumonia   . Skin cancer    Basal cell forehead  . Wears glasses     Current Outpatient Medications  Medication Sig Dispense Refill  . b complex vitamins tablet Take 1 tablet by mouth daily.    . cetirizine (ZYRTEC) 10 MG tablet Take 10 mg by mouth every evening.     . cholecalciferol (VITAMIN D3) 25 MCG (1000 UT) tablet Take 1,000 Units by mouth daily.    . famotidine (PEPCID) 20 MG tablet Take 20 mg by mouth daily.     . fluticasone (FLONASE) 50 MCG/ACT  nasal spray Place 1 spray into both nostrils daily.    . hydrochlorothiazide (HYDRODIURIL) 12.5 MG tablet Take 12.5 mg by mouth daily.     Marland Kitchen ibuprofen (ADVIL) 200 MG tablet Take 200 mg by mouth every 6 (six) hours as needed.    . meloxicam (MOBIC) 7.5 MG tablet Take 7.5 mg by mouth 2 (two) times daily. Pt taking approximately 1 tab q 3 days d/t GI upset    . montelukast (SINGULAIR) 10 MG tablet Take 10 mg by mouth every evening.     Marland Kitchen omeprazole (PRILOSEC) 20 MG capsule Take 20 mg by mouth daily.    Marland Kitchen ZINC-VITAMIN C PO Take 1 tablet by mouth daily.     No current facility-administered medications for this visit.    Physical Exam BP 139/82 (BP Location: Right Arm, Patient Position: Sitting, Cuff Size: Normal)   Pulse 69   Temp (!) 97.5 F (36.4 C)   Resp 16 Comment: RA  Ht 5\' 7"  (1.702 m)   Wt 180 lb (81.6 kg)   SpO2 96% Comment: RA  BMI 28.30 kg/m  74 year old woman in no acute distress Alert and oriented x3 with no focal deficits  Lungs clear bilaterally Cardiac regular rate and rhythm  Diagnostic Tests: CT CHEST WITHOUT CONTRAST  TECHNIQUE: Multidetector CT imaging of the chest was performed following the standard protocol without IV contrast.  COMPARISON:  10/31/2019  FINDINGS: Cardiovascular: Heart size is normal. No signs of pericardial effusion. Aorta with minimal, scattered calcific atherosclerotic change. Central pulmonary arteries are normal caliber. Vessels not well assessed due to lack of intravenous contrast.  Mediastinum/Nodes: No signs of thoracic inlet adenopathy or axillary lymphadenopathy. No mediastinal lymphadenopathy or hilar lymphadenopathy.  Lungs/Pleura: Opacity in the right upper lobe with bandlike features and nodular characteristics measuring 2.2 by 1.1 cm (image 22, series 8), showing more nodular features on the previous exam, no signs of pleural effusion. Thickness of this area on the sagittal view approximately 1 cm compared to 1.4  cm on the prior examination. Fissural nodularity along the major fissure in the right chest is unchanged. Volume loss along the major fissure in the left chest is also similar. Airways are patent.  Calcified nodule in the right lung base is unchanged.  Upper Abdomen: Incidental imaging of upper abdominal contents is unremarkable.  Musculoskeletal: Signs of left mastectomy. No signs of acute bone finding or evidence of destructive bone process.  IMPRESSION: 1. Right upper lobe lesion with bandlike features and nodular characteristics measuring 2.2 x 1.1 cm, less nodular and thickened than on the previous study. Previously shown to have increased FDG uptake and associated with fissural and lower lobe nodularity, may be related to atypical infection. Neoplasm is not excluded though not favored given the decreased size as compared to the most recent prior. Continued follow-up given appearance and patient history may be helpful. 2. Fissural nodularity along the major fissure in the right chest is unchanged. 3. Signs of left mastectomy. 4. No evidence of mediastinal or hilar lymphadenopathy.  Aortic Atherosclerosis (ICD10-I70.0).   Electronically Signed   By: Zetta Bills M.D.   On: 02/06/2020 13:00 I personally reviewed the CT images and concur with the findings noted above.  I also reviewed the images with Mr. Mrs. Pellegrino  Impression: Maleigha Fowler is a 74 year old non-smoker who was found to have a right upper lobe lung nodule last year.  She had presented with a cough.  CT showed a 3.4 x 1.6 x 4.2 cm right upper lobe nodule with some nodularity along the fissure and also an 8 mm nodule in the right middle lobe.  These areas were hypermetabolic on PET/CT.  Navigational bronchoscopy was performed and biopsy showed granulomas.  There was no evidence of malignancy.  She has been followed since then.  I saw her in November and the area was essentially unchanged.  On today's  scan there has been a significant decrease in the size of the right upper lobe opacity.  Although this does not completely rule out the possibility of cancer, it is more consistent with a resolving granulomatous process.  I recommended we repeat another CT in about 3 months as we follow that to resolution or more likely stability.  She continues to have a cough.  She asked about that.  She has not been taking anything for it.  I recommended she follow-up with Dr. Melvyn Novas.  In the meantime she could try an over-the-counter cough medication before resorting to more aggressive measures.  Plan Return in 3 months with CT chest  [name], MD [facility] [phone]

## 2020-07-02 ENCOUNTER — Other Ambulatory Visit: Payer: Self-pay | Admitting: *Deleted

## 2020-07-02 DIAGNOSIS — R911 Solitary pulmonary nodule: Secondary | ICD-10-CM

## 2020-07-18 ENCOUNTER — Other Ambulatory Visit: Payer: Medicare Other

## 2020-07-23 ENCOUNTER — Encounter: Payer: Self-pay | Admitting: Thoracic Surgery (Cardiothoracic Vascular Surgery)

## 2020-07-23 ENCOUNTER — Ambulatory Visit (INDEPENDENT_AMBULATORY_CARE_PROVIDER_SITE_OTHER): Payer: Medicare Other | Admitting: Thoracic Surgery (Cardiothoracic Vascular Surgery)

## 2020-07-23 ENCOUNTER — Other Ambulatory Visit: Payer: Self-pay

## 2020-07-23 ENCOUNTER — Ambulatory Visit
Admission: RE | Admit: 2020-07-23 | Discharge: 2020-07-23 | Disposition: A | Discharge status: Home / Self Care | Payer: Medicare Other | Source: Ambulatory Visit | Attending: Thoracic Surgery (Cardiothoracic Vascular Surgery) | Admitting: Thoracic Surgery (Cardiothoracic Vascular Surgery)

## 2020-07-23 VITALS — BP 170/73 | HR 69 | Temp 97.8°F | Resp 20 | Ht 67.0 in | Wt 184.0 lb

## 2020-07-23 DIAGNOSIS — R911 Solitary pulmonary nodule: Secondary | ICD-10-CM

## 2020-07-23 NOTE — Progress Notes (Signed)
[address]       [place] [address]             [phone]      HPI: Mrs. Bourget returns for scheduled follow-up visit  Tiffany Calmes is a 74 year old non-smoker with a history of hypertension, nonmelanoma skin cancer, breast cancer, obesity, arthritis, and reflux.  She presented with a chronic cough.  A chest x-ray showed a right upper lobe opacity.  CT of the chest showed a 3.4 x 1.6 x 4.2 cm right upper lobe opacity.  There also was an 8 mm right lower lobe nodule and multiple small nodules along the fissure.  On PET CT the upper lobe nodule was hypermetabolic.  Navigational bronchoscopy in August 2020 showed granulomas.  AFB and fungal cultures were negative.  She has been followed since then.  She still has a cough.  It usually nonproductive but occasionally she will have some clear sputum.  She is not having any fevers, chills, or night sweats.  Past Medical History:  Diagnosis Date  . Allergies   . Arthritis   . Breast cancer (HCC)    cervical  . Dyspnea    with exertion  . Early cataracts, bilateral   . GERD (gastroesophageal reflux disease)   . History of hiatal hernia   . Hypertension   . Lung nodules    right upper and lower lobe nodules  . Obesity   . Pneumonia   . Skin cancer    Basal cell forehead  . Wears glasses     Current Outpatient Medications  Medication Sig Dispense Refill  . b complex vitamins tablet Take 1 tablet by mouth daily.    . cetirizine (ZYRTEC) 10 MG tablet Take 10 mg by mouth every evening.     . cholecalciferol (VITAMIN D3) 25 MCG (1000 UT) tablet Take 1,000 Units by mouth daily.    . famotidine (PEPCID) 20 MG tablet Take 20 mg by mouth daily.     . fluticasone (FLONASE) 50 MCG/ACT nasal spray Place 1 spray into both nostrils daily.    . hydrochlorothiazide (HYDRODIURIL) 12.5 MG tablet Take 12.5 mg by mouth daily.     Marland Kitchen ibuprofen (ADVIL) 200 MG tablet Take 200 mg by mouth every 6 (six) hours as needed.    .  montelukast (SINGULAIR) 10 MG tablet Take 10 mg by mouth every evening.     Marland Kitchen omeprazole (PRILOSEC) 20 MG capsule Take 20 mg by mouth daily.    Marland Kitchen ZINC-VITAMIN C PO Take 1 tablet by mouth daily.    . meloxicam (MOBIC) 7.5 MG tablet Take 7.5 mg by mouth 2 (two) times daily. Pt taking approximately 1 tab q 3 days d/t GI upset (Patient not taking: Reported on 07/23/2020)     No current facility-administered medications for this visit.    Physical Exam BP (!) 170/73   Pulse 69   Temp 97.8 F (36.6 C) (Skin)   Resp 20   Ht 5\' 7"  (1.702 m)   Wt 184 lb (83.5 kg)   SpO2 97% Comment: RA  BMI 28.45 kg/m  74 year old woman in no acute distress Alert and oriented x3 with no focal deficits No cervical or supraclavicular adenopathy Cardiac regular rate and rhythm normal S1 and S2 Lungs clear with equal breath sounds bilaterally  Diagnostic Tests: CT CHEST WITHOUT CONTRAST  TECHNIQUE: Multidetector CT imaging of the chest was performed following the standard protocol without IV contrast.  COMPARISON:  Chest  CT 02/06/2020 and 10/31/2019  FINDINGS: Cardiovascular: The heart is normal in size. No pericardial effusion. The aorta is normal in caliber. Minimal atherosclerotic calcification at the aortic arch. No definite coronary artery calcifications.  Mediastinum/Nodes: No mediastinal or hilar mass or adenopathy. Small scattered lymph nodes are stable. The esophagus is grossly normal.  Lungs/Pleura: Stable elongated density in the right upper lobe measuring approximately 4.7 x 2.7 x 1.2 cm. This is unchanged and likely scar.  Stable nodularity along the mid aspect of the right major fissure.  Stable calcified granulomas.  Stable left basilar scarring changes.  Minimal subpleural atelectasis at the right lung base.  No new pulmonary lesions or acute pulmonary findings.  Upper Abdomen: No significant upper abdominal findings.  Musculoskeletal: No significant bony  findings.  IMPRESSION: 1. Stable elongated density in the right upper lobe, likely scar. Recommend follow-up noncontrast chest CT in 12 months to confirm 2 years of stability. 2. Stable nodularity along the right major fissure. 3. Stable calcified granulomas. 4. No new pulmonary lesions or acute pulmonary findings. 5. No mediastinal or hilar mass or adenopathy. 6. Minimal aortic atherosclerosis.  Aortic Atherosclerosis (ICD10-I70.0).  Aortic Atherosclerosis (ICD10-I70.0).   Electronically Signed   By: Marijo Sanes M.D.   On: 07/23/2020 11:06 I personally reviewed the CT images and concur with the findings noted above  Impression: Avalene Sealy is a 74 year old woman with a history of hypertension, skin cancer, breast cancer, obesity, arthritis, and reflux.  She presented with a chronic cough and was found to have an irregular opacity in the right upper lobe.  She also had some evidence of old granulomatous disease.  Navigational bronchoscopy showed granulomas.  AFB and fungal cultures were negative and she has been followed since then.  Her chronic cough is unchanged.  Her CT shows no change in the right upper lobe opacity or the other findings over the past 5 months.  We need to establish stability up to 2 years to rule out cancer.  I will plan to scan her again in 6 months.  Plan: Return in 6 months with CT chest  I spent 20 minutes today in review of records, images and in consultation with Mrs. Billy Coast, MD [facility] [phone]

## 2020-12-31 ENCOUNTER — Other Ambulatory Visit: Payer: Self-pay | Admitting: *Deleted

## 2020-12-31 DIAGNOSIS — R911 Solitary pulmonary nodule: Secondary | ICD-10-CM

## 2021-01-28 ENCOUNTER — Ambulatory Visit (INDEPENDENT_AMBULATORY_CARE_PROVIDER_SITE_OTHER): Payer: Medicare Other | Admitting: Thoracic Surgery (Cardiothoracic Vascular Surgery)

## 2021-01-28 ENCOUNTER — Other Ambulatory Visit: Payer: Self-pay

## 2021-01-28 ENCOUNTER — Encounter: Payer: Self-pay | Admitting: Thoracic Surgery (Cardiothoracic Vascular Surgery)

## 2021-01-28 ENCOUNTER — Ambulatory Visit
Admission: RE | Admit: 2021-01-28 | Discharge: 2021-01-28 | Disposition: A | Discharge status: Home / Self Care | Payer: Medicare Other | Source: Ambulatory Visit | Attending: Thoracic Surgery (Cardiothoracic Vascular Surgery) | Admitting: Thoracic Surgery (Cardiothoracic Vascular Surgery)

## 2021-01-28 VITALS — BP 168/75 | HR 78 | Temp 97.9°F | Resp 20 | Ht 67.0 in | Wt 180.0 lb

## 2021-01-28 DIAGNOSIS — R911 Solitary pulmonary nodule: Secondary | ICD-10-CM

## 2021-01-28 DIAGNOSIS — Z853 Personal history of malignant neoplasm of breast: Secondary | ICD-10-CM | POA: Diagnosis not present

## 2021-01-28 NOTE — Progress Notes (Signed)
[address]       [place] [address]             [phone]     HPI: Rebekah Taylor returns for scheduled follow-up regarding lung opacity/nodules seen on CT  Rebekah Taylor is a 75 year old non-smoker with a history of hypertension, nonmelanoma skin cancer, breast cancer, obesity, arthritis, and reflux.  She presented with a chronic cough.  Her cough began in the fall 2019.  She was treated for pneumonia in December of that year.  Chest x-ray showed a right upper lobe opacity.  CT showed a 3.4 x 1.6 x 4.2 cm right upper lobe mass and then multiple subpleural nodules along the major fissure.  QuantiFERON gold and fungal testing was negative.  PET/CT showed the upper lobe mass was hypermetabolic.  I did navigational bronchoscopy and endobronchial ultrasound.  Biopsy showed granulomas.  AFB and fungal cultures were negative.  She has been followed since that time.  I last saw her in August 2021.  There was continued progression of the scarring in the right upper lobe.  She otherwise was doing well but did still have a persistent cough.  In the interim since her last visit she is not had any respiratory issues.  Her cough persists.  She says she is "losing her voice."  Past Medical History:  Diagnosis Date  . Allergies   . Arthritis   . Breast cancer (HCC)    cervical  . Dyspnea    with exertion  . Early cataracts, bilateral   . GERD (gastroesophageal reflux disease)   . History of hiatal hernia   . Hypertension   . Lung nodules    right upper and lower lobe nodules  . Obesity   . Pneumonia   . Skin cancer    Basal cell forehead  . Wears glasses     Current Outpatient Medications  Medication Sig Dispense Refill  . b complex vitamins tablet Take 1 tablet by mouth daily.    . cetirizine (ZYRTEC) 10 MG tablet Take 10 mg by mouth every evening.     . cholecalciferol (VITAMIN D3) 25 MCG (1000 UT) tablet Take 1,000 Units by mouth daily.    . famotidine (PEPCID)  20 MG tablet Take 20 mg by mouth daily.     . fluticasone (FLONASE) 50 MCG/ACT nasal spray Place 1 spray into both nostrils daily.    . hydrochlorothiazide (HYDRODIURIL) 12.5 MG tablet Take 12.5 mg by mouth daily.     Marland Kitchen ibuprofen (ADVIL) 200 MG tablet Take 200 mg by mouth every 6 (six) hours as needed.    . meloxicam (MOBIC) 7.5 MG tablet Take 7.5 mg by mouth 2 (two) times daily. Pt taking approximately 1 tab q 3 days d/t GI upset (Patient not taking: Reported on 07/23/2020)    . montelukast (SINGULAIR) 10 MG tablet Take 10 mg by mouth every evening.     Marland Kitchen omeprazole (PRILOSEC) 20 MG capsule Take 20 mg by mouth daily.    Marland Kitchen ZINC-VITAMIN C PO Take 1 tablet by mouth daily.     No current facility-administered medications for this visit.    Physical Exam BP (!) 168/75 (BP Location: Right Arm, Patient Position: Sitting, Cuff Size: Normal)   Pulse 78   Temp 97.9 F (36.6 C) (Skin)   Resp 20   Ht 5\' 7"  (1.702 m)   Wt 180 lb (81.6 kg)   SpO2 95% Comment: RA  BMI 28.19 kg/m  75 year old woman in no acute distress Alert and oriented x3 with no focal deficits Lungs clear with equal breath sounds bilaterally Cardiac regular rate and rhythm  Diagnostic Tests: CT CHEST WITHOUT CONTRAST  TECHNIQUE: Multidetector CT imaging of the chest was performed following the standard protocol without IV contrast.  COMPARISON:  07/23/2020  FINDINGS: Cardiovascular: Aortic atherosclerosis.  Borderline cardiomegaly.  Mediastinum/Nodes: A left thyroid nodule of 7 mm is present and similar on the prior. Not clinically significant; no follow-up imaging recommended (ref: J Am Coll Radiol. 2015 Feb;12(2): 143-50).  No axillary adenopathy. No mediastinal or definite hilar adenopathy, given limitations of unenhanced CT. Tiny hiatal hernia.  Lungs/Pleura: No pleural fluid. Minimal motion degradation in the lower chest.  Anterior left lower lobe scarring and mild volume loss.  Nodularity along  the major fissures is greater right than left and similar to on the prior exam. Example at up to 5 mm along the right major fissure on 77/8. Example at up to 4 mm along the left major fissure on 73/8.  Right apical and upper lobe linear opacity again identified. In the lateral right apex, measures maximally 9 mm in thickness today on 17/3, similar.  Contiguous area of triangular soft tissue thickening within the right upper lobe measures 2.1 x 1.1 cm on 27/8 versus 2.7 x 1.3 cm on the prior exam at the same level.  More inferior and medial component measures 5 mm on 32/8 versus 7 mm the same level on the prior exam.  Upper Abdomen: Normal imaged portions of the liver, spleen, adrenal glands, kidneys.  Musculoskeletal: Left mastectomy. Mild convex right thoracic spine curvature.  IMPRESSION: 1. Regression of primarily linear right upper lobe opacity, favoring evolving scar. 2. Similar nodularity along both major fissures, favoring a benign etiology. 3. No findings to suggest metastatic disease. 4.  Aortic Atherosclerosis (ICD10-I70.0).   Electronically Signed   By: Abigail Miyamoto M.D.   On: 01/28/2021 12:35 I personally reviewed the CT images.  Progression of scar right upper lobe.  Remaining nodules stable.  Impression: Rebekah Taylor is a 75 year old woman who originally presented with a cough in the fall 2019.  Work-up eventually showed a 3.4 x 1.6 x 4.2 cm right upper lobe opacity that was hypermetabolic on PET/CT.  Navigational bronchoscopy and biopsy showed granulomas.  AFB and fungal cultures were negative.  She is been followed since that time.  She said continued evolution of the right upper lobe opacity as it has become smaller and more linear consistent with scarring.  She does still have nodularity along the fissures which most likely is benign as well.  I did recommend that we repeat one more CT scan in a year just to be certain of those other nodules.  She  has been having some difficulty with her voice.  Her husband was not aware that.  She still has a chronic cough which may be the genesis of that.  She has an appointment with her primary in the next week or 2 to further evaluate that.  She probably needs an ENT evaluation.  Plan: Return in 1 year with CT chest  I spent over 20 minutes in review of records, images, and consultation with Rebekah Taylor today. [name], MD [facility] [phone]

## 2021-12-09 ENCOUNTER — Other Ambulatory Visit: Payer: Self-pay | Admitting: Thoracic Surgery (Cardiothoracic Vascular Surgery)

## 2021-12-09 DIAGNOSIS — R911 Solitary pulmonary nodule: Secondary | ICD-10-CM

## 2022-02-03 ENCOUNTER — Other Ambulatory Visit: Payer: Medicare Other

## 2022-02-03 ENCOUNTER — Ambulatory Visit: Payer: Medicare Other | Admitting: Thoracic Surgery (Cardiothoracic Vascular Surgery)

## 2022-02-17 ENCOUNTER — Ambulatory Visit
Admission: RE | Admit: 2022-02-17 | Discharge: 2022-02-17 | Disposition: A | Discharge status: Home / Self Care | Payer: Medicare Other | Source: Ambulatory Visit | Attending: Thoracic Surgery (Cardiothoracic Vascular Surgery) | Admitting: Thoracic Surgery (Cardiothoracic Vascular Surgery)

## 2022-02-17 ENCOUNTER — Ambulatory Visit (INDEPENDENT_AMBULATORY_CARE_PROVIDER_SITE_OTHER): Payer: Medicare Other | Admitting: Thoracic Surgery (Cardiothoracic Vascular Surgery)

## 2022-02-17 ENCOUNTER — Ambulatory Visit: Payer: Medicare Other | Admitting: Thoracic Surgery (Cardiothoracic Vascular Surgery)

## 2022-02-17 ENCOUNTER — Encounter: Payer: Self-pay | Admitting: Thoracic Surgery (Cardiothoracic Vascular Surgery)

## 2022-02-17 ENCOUNTER — Other Ambulatory Visit: Payer: Self-pay

## 2022-02-17 VITALS — BP 128/63 | HR 63 | Resp 20 | Ht 67.0 in | Wt 177.0 lb

## 2022-02-17 DIAGNOSIS — R918 Other nonspecific abnormal finding of lung field: Secondary | ICD-10-CM | POA: Diagnosis not present

## 2022-02-17 DIAGNOSIS — R911 Solitary pulmonary nodule: Secondary | ICD-10-CM

## 2022-02-17 NOTE — Progress Notes (Signed)
? ?   ?[address] ?      [name] [address] ?            [phone]   ? ?  ? ?HPI: Rebekah Taylor returns for a follow-up regarding lung nodules seen on CT. ? ?Rebekah Taylor is a 76 year old woman with a history of hypertension, skin cancer, breast cancer, obesity, arthritis, and reflux.  She presented in the fall 2019 with a cough.  She is a lifelong non-smoker.  She developed a cough in the fall 2019.  She was treated empirically for pneumonia.  Her chest x-ray showed a right upper lobe opacity.  On CT there was a 3.4 x 1.6 x 4.2 cm right upper lobe mass with multiple subpleural nodules along the major fissure bilaterally.  1 from gold and fungal testing was negative.  On PET/CT the mass was hypermetabolic.  Navigational bronchoscopy and biopsy showed granulomatous disease.  AFB and fungal cultures were negative.  I have been following her since that time. ? ?I last saw her in February 2022.  That was almost a year and a half from her previous visit.  She was having some difficulty with hoarseness at that time.  The right upper lobe opacity was smaller and more linear consistent with scarring.  The other nodularity was stable. ? ?In the interim since her last visit she has been feeling well.  She has not had any significant respiratory issues. ? ?Past Medical History:  ?Diagnosis Date  ? Allergies   ? Arthritis   ? Breast cancer ([place])   ? cervical  ? Dyspnea   ? with exertion  ? Early cataracts, bilateral   ? GERD (gastroesophageal reflux disease)   ? History of hiatal hernia   ? Hypertension   ? Lung nodules   ? right upper and lower lobe nodules  ? Obesity   ? Pneumonia   ? Skin cancer   ? Basal cell forehead  ? Wears glasses   ? ? ?Current Outpatient Medications  ?Medication Sig Dispense Refill  ? b complex vitamins tablet Take 1 tablet by mouth daily.    ? cetirizine (ZYRTEC) 10 MG tablet Take 10 mg by mouth every evening.     ? cholecalciferol (VITAMIN D3) 25 MCG (1000 UT) tablet Take 1,000  Units by mouth daily.    ? famotidine (PEPCID) 20 MG tablet Take 20 mg by mouth daily.     ? fluticasone (FLONASE) 50 MCG/ACT nasal spray Place 1 spray into both nostrils daily.    ? hydrochlorothiazide (HYDRODIURIL) 12.5 MG tablet Take 12.5 mg by mouth daily.     ? ibuprofen (ADVIL) 200 MG tablet Take 200 mg by mouth every 6 (six) hours as needed.    ? omeprazole (PRILOSEC) 20 MG capsule Take 20 mg by mouth daily.    ? ZINC-VITAMIN C PO Take 1 tablet by mouth daily.    ? ?No current facility-administered medications for this visit.  ? ? ?Physical Exam ?BP 128/63 (BP Location: Right Arm, Patient Position: Sitting, Cuff Size: Large)   Pulse 63   Resp 20   Ht '5\' 7"'$  (1.702 m)   Wt 177 lb (80.3 kg)   SpO2 95% Comment: RA  BMI 27.40 kg/m?  ?76 year old woman in no acute distress ?Alert and oriented x3 with no focal deficits ?Lungs clear with equal breath sounds bilaterally ?Cardiac regular rate and rhythm ? ?Diagnostic Tests: ?CT CHEST WITHOUT CONTRAST ?  ?TECHNIQUE: ?Multidetector CT imaging of the  chest was performed following the ?standard protocol without IV contrast. ?  ?RADIATION DOSE REDUCTION: This exam was performed according to the ?departmental dose-optimization program which includes automated ?exposure control, adjustment of the mA and/or kV according to ?patient size and/or use of iterative reconstruction technique. ?  ?COMPARISON:  Previous studies including the examination of ?01/28/2021 ?  ?FINDINGS: ?Cardiovascular: Unremarkable. ?  ?Mediastinum/Nodes: No new significant lymphadenopathy seen. There is ?12 mm low-density nodule in the left lobe of thyroid which has not ?changed significantly. ?  ?Lungs/Pleura: Calcified nodules are seen in the right hilum and ?right lower lobe. There is linear nodular density in the right upper ?lung fields with no significant interval change. There are few ?scattered pleural nodules in the major fissures on both sides with ?no significant interval change. Small  linear densities seen in the ?lingula suggesting scarring or subsegmental atelectasis with no ?significant interval change. There are no new lung nodules. There is ?no pleural effusion or pneumothorax. ?  ?Upper Abdomen: Small hiatal hernia is seen. There is previous left ?mastectomy. There is mild hyperplasia of adrenals with no ?significant interval change. ?  ?Musculoskeletal: Unremarkable. ?  ?IMPRESSION: ?Linear nodular density in the right upper lobe appears stable, ?possibly suggesting scarring. There are few scattered pleural ?nodules in the interlobar fissures on both sides which appears ?stable. There are no new lung nodules. No new significant ?lymphadenopathy seen. ?  ?Small hiatal hernia. There is 1.2 cm low-density nodule in the left ?lobe of thyroid with no significant interval change. ?  ?  ?Electronically Signed ?  By: Elmer Picker M.D. ?  On: 02/17/2022 12:20 ?I personally reviewed the CT images.  Stable from a year ago.  Significantly decreased from years prior. ? ?Impression: ?Rebekah Taylor is a 76 year old woman with a past medical history significant for hypertension, skin cancer, breast cancer, obesity, arthritis, reflux, and granulomatous disease of the lung.  She first presented in the fall 2019.  She was first scanned in June 2020.  There was a right upper lobe opacity.  Navigational bronchoscopy and biopsy showed granulomas.  Cultures were negative so she was never specifically treated with antibiotics.  Over time the area has gotten smaller and become more linear consistent with scarring of an old infectious process.  There has been no change over the past year and there is significant decrease in size from her earlier scans.  Given that she is a lifelong non-smoker I do not think there is any reason for continued follow-up. ? ? ?Plan:  ?She will follow-up as needed. ? ?I spent over 20 minutes in review of records, images, and in consultation with Rebekah Taylor today. ?[name], MD ?[facility] ?[phone] ? ? ? ? ?

## 2022-06-11 NOTE — H&P (Signed)
Surgical History & Physical  Patient Name: Rebekah Taylor DOB: 08-29-1946  Surgery: Cataract extraction with intraocular lens implant phacoemulsification; Left Eye  Surgeon: Baruch Goldmann MD Surgery Date:  06-22-22 Pre-Op Date:  06-04-22 HPI: A 75 Yr. old female patient is referred by Dr Lonia Chimera from [place] for cataract eval. 1. The patient complains of difficulty when driving, which began 2 years ago. Both eyes are affected. The episode is gradual. The condition's severity increased since last visit. Symptoms occur when the patient is driving, inside and outside. The complaint is associated with glare. This is negatively affecting the patient's quality of life and the patient is unable to function adequately in life with the current level of vision. HPI was performed by Baruch Goldmann .  Medical History: Arthritis High Blood Pressure  Review of Systems Negative Allergic/Immunologic Negative Cardiovascular Negative Constitutional Negative Ear, Nose, Mouth & Throat Negative Endocrine Negative Eyes Negative Gastrointestinal Negative Genitourinary Negative Hemotologic/Lymphatic Negative Integumentary Negative Musculoskeletal Negative Neurological Negative Psychiatry Negative Respiratory  Social   Never smoked   Medication  Amoxicillin, HCTZ, Omeprazole, Fluticasone, Cetirizine, Famotidine, Omega-3, Vitamin C, Zinc, Vitamin D3,   Sx/Procedures Eyelid - Epilation of Cilia,  Knee Replacement, Mastectomy, Hysterectomy,   Drug Allergies   NKDA  History & Physical: Heent: Cataract, left eye NECK: supple without bruits LUNGS: lungs clear to auscultation CV: regular rate and rhythm Abdomen: soft and non-tender Impression & Plan: Assessment: 1.  COMBINED FORMS AGE RELATED CATARACT; Both Eyes (H25.813) 2.  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  BLEPHARITIS; Right Upper Lid, Right Lower Lid, Left Upper Lid, Left Lower Lid (H01.001, H01.002,H01.004,H01.005) 4.   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  Pinguecula; Both Eyes (H11.153) 6.  ASTIGMATISM, REGULAR; Both Eyes (H52.223)  Plan: 1.  Cataract accounts for the patient's decreased vision. This visual impairment is not correctable with a tolerable change in glasses or contact lenses. Cataract surgery with an implantation of a new lens should significantly improve the visual and functional status of the patient. Discussed all risks, benefits, alternatives, and potential complications. Discussed the procedures and recovery. Patient desires to have surgery. A-scan ordered and performed today for intra-ocular lens calculations. The surgery will be performed in order to improve vision for driving, reading, and for eye examinations. Recommend phacoemulsification with intra-ocular lens. Recommend Dextenza for post-operative pain and inflammation. Left Eye worse - first. Dilates well - shugarcaine by protocol. Consider Toric Lens.  2.  Based on cup-to-disc ratio. Negative Family history. OCT rNFL shows: thinning OS. Detailed discussion about glaucoma today including importance of maintaining good follow up and following treatment plan, and the possibility of irreversible blindness as part of this disease process.  3.  Recommend regular lid cleaning  4.  Lashes removed with jeweler's forceps. No complications.  5.  Observe; Artificial tears as needed for irritation.  6.  Recommend Toric IOL OU.

## 2022-06-15 ENCOUNTER — Encounter (HOSPITAL_COMMUNITY)
Admission: RE | Admit: 2022-06-15 | Discharge: 2022-06-15 | Disposition: A | Discharge status: Home / Self Care | Payer: Medicare Other | Source: Ambulatory Visit | Attending: Ophthalmology | Admitting: Ophthalmology

## 2022-06-22 ENCOUNTER — Ambulatory Visit (HOSPITAL_COMMUNITY): Payer: Medicare Other | Admitting: Anesthesiology

## 2022-06-22 ENCOUNTER — Ambulatory Visit (HOSPITAL_BASED_OUTPATIENT_CLINIC_OR_DEPARTMENT_OTHER): Payer: Medicare Other | Admitting: Anesthesiology

## 2022-06-22 ENCOUNTER — Encounter (HOSPITAL_COMMUNITY)
Admission: RE | Disposition: A | Discharge status: Home / Self Care | Payer: Self-pay | Source: Home / Self Care | Attending: Ophthalmology

## 2022-06-22 ENCOUNTER — Encounter (HOSPITAL_COMMUNITY): Payer: Self-pay | Admitting: Ophthalmology

## 2022-06-22 ENCOUNTER — Ambulatory Visit (HOSPITAL_COMMUNITY)
Admission: RE | Admit: 2022-06-22 | Discharge: 2022-06-22 | Disposition: A | Discharge status: Home / Self Care | Payer: Medicare Other | Attending: Ophthalmology | Admitting: Ophthalmology

## 2022-06-22 DIAGNOSIS — Z79899 Other long term (current) drug therapy: Secondary | ICD-10-CM | POA: Insufficient documentation

## 2022-06-22 DIAGNOSIS — H25812 Combined forms of age-related cataract, left eye: Secondary | ICD-10-CM | POA: Diagnosis present

## 2022-06-22 DIAGNOSIS — M1990 Unspecified osteoarthritis, unspecified site: Secondary | ICD-10-CM | POA: Diagnosis not present

## 2022-06-22 DIAGNOSIS — H52202 Unspecified astigmatism, left eye: Secondary | ICD-10-CM

## 2022-06-22 DIAGNOSIS — K219 Gastro-esophageal reflux disease without esophagitis: Secondary | ICD-10-CM | POA: Diagnosis not present

## 2022-06-22 DIAGNOSIS — I10 Essential (primary) hypertension: Secondary | ICD-10-CM | POA: Insufficient documentation

## 2022-06-22 DIAGNOSIS — H269 Unspecified cataract: Secondary | ICD-10-CM

## 2022-06-22 DIAGNOSIS — K449 Diaphragmatic hernia without obstruction or gangrene: Secondary | ICD-10-CM | POA: Diagnosis not present

## 2022-06-22 HISTORY — PX: CATARACT EXTRACTION W/PHACO: SHX586

## 2022-06-22 SURGERY — PHACOEMULSIFICATION, CATARACT, WITH IOL INSERTION
Anesthesia: Monitor Anesthesia Care | Site: Eye | Laterality: Left

## 2022-06-22 MED ORDER — TROPICAMIDE 1 % OP SOLN
1.0000 [drp] | OPHTHALMIC | Status: AC | PRN
  Administered 2022-06-22 (×3): 1 [drp] via OPHTHALMIC

## 2022-06-22 MED ORDER — MIDAZOLAM HCL 2 MG/2ML IJ SOLN
INTRAMUSCULAR | Status: DC | PRN
  Administered 2022-06-22: 1 mg via INTRAVENOUS

## 2022-06-22 MED ORDER — BSS IO SOLN
INTRAOCULAR | Status: DC | PRN
  Administered 2022-06-22: 15 mL via INTRAOCULAR

## 2022-06-22 MED ORDER — SODIUM HYALURONATE 10 MG/ML IO SOLUTION
PREFILLED_SYRINGE | INTRAOCULAR | Status: DC | PRN
  Administered 2022-06-22: 0.85 mL via INTRAOCULAR

## 2022-06-22 MED ORDER — STERILE WATER FOR IRRIGATION IR SOLN
Status: DC | PRN
  Administered 2022-06-22: 250 mL

## 2022-06-22 MED ORDER — MIDAZOLAM HCL 2 MG/2ML IJ SOLN
INTRAMUSCULAR | Status: AC
  Filled 2022-06-22: qty 2

## 2022-06-22 MED ORDER — EPINEPHRINE PF 1 MG/ML IJ SOLN
INTRAOCULAR | Status: DC | PRN
  Administered 2022-06-22: 500 mL

## 2022-06-22 MED ORDER — SODIUM CHLORIDE 0.9% FLUSH
INTRAVENOUS | Status: DC | PRN
  Administered 2022-06-22: 5 mL via INTRAVENOUS

## 2022-06-22 MED ORDER — PHENYLEPHRINE HCL 2.5 % OP SOLN
1.0000 [drp] | OPHTHALMIC | Status: AC | PRN
  Administered 2022-06-22 (×3): 1 [drp] via OPHTHALMIC

## 2022-06-22 MED ORDER — LIDOCAINE HCL 3.5 % OP GEL
1.0000 | Freq: Once | OPHTHALMIC | Status: AC
  Administered 2022-06-22: 1 via OPHTHALMIC

## 2022-06-22 MED ORDER — EPINEPHRINE PF 1 MG/ML IJ SOLN
INTRAMUSCULAR | Status: AC
  Filled 2022-06-22: qty 2

## 2022-06-22 MED ORDER — SODIUM HYALURONATE 23MG/ML IO SOSY
PREFILLED_SYRINGE | INTRAOCULAR | Status: DC | PRN
  Administered 2022-06-22: 0.6 mL via INTRAOCULAR

## 2022-06-22 MED ORDER — POVIDONE-IODINE 5 % OP SOLN
OPHTHALMIC | Status: DC | PRN
  Administered 2022-06-22: 1 via OPHTHALMIC

## 2022-06-22 MED ORDER — LIDOCAINE HCL (PF) 1 % IJ SOLN
INTRAOCULAR | Status: DC | PRN
  Administered 2022-06-22: 1 mL via OPHTHALMIC

## 2022-06-22 MED ORDER — NEOMYCIN-POLYMYXIN-DEXAMETH 3.5-10000-0.1 OP SUSP
OPHTHALMIC | Status: DC | PRN
  Administered 2022-06-22: 1 [drp] via OPHTHALMIC

## 2022-06-22 MED ORDER — TETRACAINE HCL 0.5 % OP SOLN
1.0000 [drp] | OPHTHALMIC | Status: AC | PRN
  Administered 2022-06-22 (×3): 1 [drp] via OPHTHALMIC

## 2022-06-22 SURGICAL SUPPLY — 13 items
CATARACT SUITE SIGHTPATH (MISCELLANEOUS) ×2 IMPLANT
CLOTH BEACON ORANGE TIMEOUT ST (SAFETY) ×2 IMPLANT
EYE SHIELD UNIVERSAL CLEAR (GAUZE/BANDAGES/DRESSINGS) ×1 IMPLANT
FEE CATARACT SUITE SIGHTPATH (MISCELLANEOUS) ×1 IMPLANT
GLOVE BIOGEL PI IND STRL 7.0 (GLOVE) ×2 IMPLANT
GLOVE BIOGEL PI INDICATOR 7.0 (GLOVE) ×2
LENS IOL EYHANCE TRC 150 25.5 IMPLANT
LENS IOL TORIC DIU150 25.5 ×2 IMPLANT
PAD ARMBOARD 7.5X6 YLW CONV (MISCELLANEOUS) ×2 IMPLANT
SYR TB 1ML LL NO SAFETY (SYRINGE) ×2 IMPLANT
TAPE SURG TRANSPORE 1 IN (GAUZE/BANDAGES/DRESSINGS) IMPLANT
TAPE SURGICAL TRANSPORE 1 IN (GAUZE/BANDAGES/DRESSINGS) ×1
WATER STERILE IRR 250ML POUR (IV SOLUTION) ×2 IMPLANT

## 2022-06-22 NOTE — Anesthesia Procedure Notes (Signed)
Procedure Name: MAC Date/Time: 06/22/2022 9:50 AM  Performed by: Orlie Dakin, CRNAPre-anesthesia Checklist: Patient identified, Emergency Drugs available and Suction available Patient Re-evaluated:Patient Re-evaluated prior to induction Oxygen Delivery Method: Nasal cannula Placement Confirmation: positive ETCO2

## 2022-06-22 NOTE — Transfer of Care (Signed)
Immediate Anesthesia Transfer of Care Note  Patient: Rebekah Taylor  Procedure(s) Performed: CATARACT EXTRACTION PHACO AND INTRAOCULAR LENS PLACEMENT (IOC) (Left: Eye)  Patient Location: Short Stay  Anesthesia Type:MAC  Level of Consciousness: awake, alert  and oriented  Airway & Oxygen Therapy: Patient Spontanous Breathing  Post-op Assessment: Report given to RN and Post -op Vital signs reviewed and stable  Post vital signs: Reviewed and stable  Last Vitals:  Vitals Value Taken Time  BP    Temp    Pulse    Resp    SpO2      Last Pain:  Vitals:   06/22/22 0906  TempSrc: Oral  PainSc: 0-No pain         Complications: No notable events documented.

## 2022-06-22 NOTE — Interval H&P Note (Signed)
History and Physical Interval Note:  06/22/2022 9:42 AM  Rebekah Taylor  has presented today for surgery, with the diagnosis of combined forms age related cataract; left.  The various methods of treatment have been discussed with the patient and family. After consideration of risks, benefits and other options for treatment, the patient has consented to  Procedure(s) with comments: CATARACT EXTRACTION PHACO AND INTRAOCULAR LENS PLACEMENT (IOC) (Left) - CDE:  as a surgical intervention.  The patient's history has been reviewed, patient examined, no change in status, stable for surgery.  I have reviewed the patient's chart and labs.  Questions were answered to the patient's satisfaction.     Baruch Goldmann

## 2022-06-22 NOTE — Op Note (Signed)
Date of procedure: 06/22/22  Pre-operative diagnosis: Visually significant age-related combined form cataract, Left Eye; Visually Significant Astigmatism, Left Eye (H25.812)  Post-operative diagnosis: Visually significant age-related combined cataract, Left Eye; Visually Significant Astigmatism, Left Eye  Procedure: Removal of cataract via phacoemulsification and insertion of intra-ocular lens Wynetta Emery and Johnson DIU150 +25.5D into the capsular bag of the Left Eye  Attending surgeon: Gerda Diss. Zarriah Starkel, MD, MA  Anesthesia: MAC, Topical Akten  Complications: None  Estimated Blood Loss: <14m (minimal)  Specimens: None  Implants: As above  Indications:  Visually significant age-related cataract, Left Eye; Visually Significant Astigmatism, Left Eye  Procedure:  The patient was seen and identified in the pre-operative area. The operative eye was identified and dilated.  The operative eye was marked.  Pre-operative toric markers were used to mark the eye at 0 and 180 degrees. Topical anesthesia was administered to the operative eye.     The patient was then to the operative suite and placed in the supine position.  A timeout was performed confirming the patient, procedure to be performed, and all other relevant information.   The patient's face was prepped and draped in the usual fashion for intra-ocular surgery.  A lid speculum was placed into the operative eye and the surgical microscope moved into place and focused.  A superotemporal paracentesis was created using a 20 gauge paracentesis blade.  Shugarcaine was injected into the anterior chamber.  Viscoelastic was injected into the anterior chamber.  A temporal clear-corneal main wound incision was created using a 2.478mmicrokeratome.  A continuous curvilinear capsulorrhexis was initiated using an irrigating cystitome and completed using capsulorrhexis forceps.  Hydrodissection and hydrodeliniation were performed.  Viscoelastic was injected into  the anterior chamber.  A phacoemulsification handpiece and a chopper as a second instrument were used to remove the nucleus and epinucleus. The irrigation/aspiration handpiece was used to remove any remaining cortical material.   The capsular bag was reinflated with viscoelastic, checked, and found to be intact.  The eye was marked to the per-op meridian.  The intraocular lens was inserted into the capsular bag and dialed into place using a Kuglen hook to 97 degrees.  The irrigation/aspiration handpiece was used to remove any remaining viscoelastic.  The clear corneal wound and paracentesis wounds were then hydrated and checked with Weck-Cels to be watertight.  The lid-speculum and drape was removed, and the patient's face was cleaned with a wet and dry 4x4.  Maxitrol was instilled in the eye before a clear shield was taped over the eye. The patient was taken to the post-operative care unit in good condition, having tolerated the procedure well.  Post-Op Instructions: [facility] and will receive all other orders and instructions.

## 2022-06-22 NOTE — Anesthesia Preprocedure Evaluation (Addendum)
Anesthesia Evaluation  Patient identified by MRN, date of birth, ID band Patient awake    Reviewed: Allergy & Precautions, NPO status , Patient's Chart, lab work & pertinent test results  Airway Mallampati: I  TM Distance: >3 FB Neck ROM: Full    Dental  (+) Dental Advisory Given, Chipped,  Crown, bridge :   Pulmonary shortness of breath and with exertion, pneumonia,    Pulmonary exam normal breath sounds clear to auscultation       Cardiovascular Exercise Tolerance: Good hypertension, Pt. on medications Normal cardiovascular exam Rhythm:Regular Rate:Normal     Neuro/Psych negative neurological ROS  negative psych ROS   GI/Hepatic Neg liver ROS, hiatal hernia, GERD  Medicated and Controlled,  Endo/Other  negative endocrine ROS  Renal/GU negative Renal ROS  negative genitourinary   Musculoskeletal  (+) Arthritis , Osteoarthritis,    Abdominal   Peds negative pediatric ROS (+)  Hematology negative hematology ROS (+)   Anesthesia Other Findings   Reproductive/Obstetrics negative OB ROS                           Anesthesia Physical Anesthesia Plan  ASA: 3  Anesthesia Plan: MAC   Post-op Pain Management: Minimal or no pain anticipated   Induction:   PONV Risk Score and Plan: 0  Airway Management Planned: Natural Airway and Nasal Cannula  Additional Equipment:   Intra-op Plan:   Post-operative Plan:   Informed Consent: I have reviewed the patients History and Physical, chart, labs and discussed the procedure including the risks, benefits and alternatives for the proposed anesthesia with the patient or authorized representative who has indicated his/her understanding and acceptance.     Dental advisory given  Plan Discussed with: Surgeon and CRNA  Anesthesia Plan Comments:        Anesthesia Quick Evaluation

## 2022-06-22 NOTE — Anesthesia Postprocedure Evaluation (Signed)
Anesthesia Post Note  Patient: Captola Teschner  Procedure(s) Performed: CATARACT EXTRACTION PHACO AND INTRAOCULAR LENS PLACEMENT (IOC) (Left: Eye)  Patient location during evaluation: Phase II Anesthesia Type: MAC Level of consciousness: awake and alert and oriented Pain management: pain level controlled Vital Signs Assessment: post-procedure vital signs reviewed and stable Respiratory status: spontaneous breathing, nonlabored ventilation and respiratory function stable Cardiovascular status: stable and blood pressure returned to baseline Postop Assessment: no apparent nausea or vomiting Anesthetic complications: no   No notable events documented.   Last Vitals:  Vitals:   06/22/22 0906 06/22/22 1006  BP: (!) 143/85 (!) 147/84  Pulse: 66 61  Resp: 14 18  Temp: 36.7 C 36.6 C  SpO2: 96% 98%    Last Pain:  Vitals:   06/22/22 1006  TempSrc: Oral  PainSc: 0-No pain                 Cutberto Winfree C Cassie Shedlock

## 2022-06-22 NOTE — Discharge Instructions (Signed)
Please discharge patient when stable, will follow up today with Dr. Marisa Hua at the [facility] office at 10:50AM today following discharge.  Leave shield in place until visit.  All paperwork with discharge instructions will be given at the office.  [facility] Address:  [address]  [place], [place] [address]

## 2022-06-23 ENCOUNTER — Encounter (HOSPITAL_COMMUNITY): Payer: Self-pay | Admitting: Ophthalmology

## 2022-06-30 ENCOUNTER — Encounter (HOSPITAL_COMMUNITY)
Admission: RE | Admit: 2022-06-30 | Discharge: 2022-06-30 | Disposition: A | Discharge status: Home / Self Care | Payer: Medicare Other | Source: Ambulatory Visit | Attending: Ophthalmology | Admitting: Ophthalmology

## 2022-07-02 NOTE — H&P (Signed)
Surgical History & Physical  Patient Name: Rebekah Taylor DOB: 1946/06/27  Surgery: Cataract extraction with intraocular lens implant phacoemulsification; Right Eye  Surgeon: Baruch Goldmann MD Surgery Date:  07-06-22 Pre-Op Date:  06-29-22  HPI: A 89 Yr. old female patient 1. The patient is returning after cataract surgery. The left eye is affected. Status post cataract surgery, which began 1 weeks ago: Since the last visit, the affected area is doing well. The patient's vision is improved. Patient is following medication instructions. 2. 2. The patient is returning for a cataract follow-up of the right eye. The affected area is worsening. The patient's vision is blurry. The patient complains of difficulty driving at night due to glare/halos, difficulty reading traffic signs/street signs, and experiencing glare on bright sunny days. This is negatively affecting the patient's quality of life and the patient is unable to function adequately in life with the current level of vision. HPI was performed by Baruch Goldmann .  Medical History: Arthritis High Blood Pressure  Review of Systems Negative Allergic/Immunologic Negative Cardiovascular Negative Constitutional Negative Ear, Nose, Mouth & Throat Negative Endocrine Negative Eyes Negative Gastrointestinal Negative Genitourinary Negative Hemotologic/Lymphatic Negative Integumentary Negative Musculoskeletal Negative Neurological Negative Psychiatry Negative Respiratory  Social   Never smoked   Medication Prednisolone-Moxifloxacin-Bromfenac,  Amoxicillin, HCTZ, Omeprazole, Fluticasone, Cetirizine, Famotidine, Omega-3, Vitamin C, Zinc, Vitamin D3,   Sx/Procedures Eyelid - Epilation of Cilia, Phaco c IOL OS,  Knee Replacement, Mastectomy, Hysterectomy,   Drug Allergies   NKDA  History & Physical: Heent: Cataract, left eye NECK: supple without bruits LUNGS: lungs clear to auscultation CV: regular rate and rhythm Abdomen: soft  and non-tender Impression & Plan: Assessment: 1.  CATARACT EXTRACTION STATUS; Left Eye (Z98.42) 2.  COMBINED FORMS AGE RELATED CATARACT; Right Eye (H25.811) 3.  ASTIGMATISM, REGULAR; Both Eyes (H52.223)  Plan: 1.  1 week after cataract surgery. Doing well with improved vision and normal eye pressure. Call with any problems or concerns. Continue Pred-Moxi-Brom 2x/day for 3 more weeks.  2.  Cataract accounts for the patient's decreased vision. This visual impairment is not correctable with a tolerable change in glasses or contact lenses. Cataract surgery with an implantation of a new lens should significantly improve the visual and functional status of the patient. Discussed all risks, benefits, alternatives, and potential complications. Discussed the procedures and recovery. Patient desires to have surgery. A-scan ordered and performed today for intra-ocular lens calculations. The surgery will be performed in order to improve vision for driving, reading, and for eye examinations. Recommend phacoemulsification with intra-ocular lens. Recommend Dextenza for post-operative pain and inflammation. Right Eye. Surgery required to correct imbalance of vision. Dilates well - shugarcaine by protocol. Toric Lens.  3.  Recommend Toric IOL OU.

## 2022-07-06 ENCOUNTER — Ambulatory Visit (HOSPITAL_COMMUNITY): Payer: Medicare Other | Admitting: Certified Registered"

## 2022-07-06 ENCOUNTER — Ambulatory Visit (HOSPITAL_BASED_OUTPATIENT_CLINIC_OR_DEPARTMENT_OTHER): Payer: Medicare Other | Admitting: Certified Registered"

## 2022-07-06 ENCOUNTER — Ambulatory Visit (HOSPITAL_COMMUNITY)
Admission: RE | Admit: 2022-07-06 | Discharge: 2022-07-06 | Disposition: A | Discharge status: Home / Self Care | Payer: Medicare Other | Attending: Ophthalmology | Admitting: Ophthalmology

## 2022-07-06 ENCOUNTER — Encounter (HOSPITAL_COMMUNITY): Payer: Self-pay | Admitting: Ophthalmology

## 2022-07-06 ENCOUNTER — Encounter (HOSPITAL_COMMUNITY)
Admission: RE | Disposition: A | Discharge status: Home / Self Care | Payer: Self-pay | Source: Home / Self Care | Attending: Ophthalmology

## 2022-07-06 DIAGNOSIS — M1990 Unspecified osteoarthritis, unspecified site: Secondary | ICD-10-CM

## 2022-07-06 DIAGNOSIS — Z9842 Cataract extraction status, left eye: Secondary | ICD-10-CM | POA: Diagnosis not present

## 2022-07-06 DIAGNOSIS — I10 Essential (primary) hypertension: Secondary | ICD-10-CM | POA: Diagnosis not present

## 2022-07-06 DIAGNOSIS — H25811 Combined forms of age-related cataract, right eye: Secondary | ICD-10-CM

## 2022-07-06 DIAGNOSIS — H52223 Regular astigmatism, bilateral: Secondary | ICD-10-CM | POA: Diagnosis not present

## 2022-07-06 DIAGNOSIS — K449 Diaphragmatic hernia without obstruction or gangrene: Secondary | ICD-10-CM | POA: Diagnosis not present

## 2022-07-06 HISTORY — PX: CATARACT EXTRACTION W/PHACO: SHX586

## 2022-07-06 SURGERY — PHACOEMULSIFICATION, CATARACT, WITH IOL INSERTION
Anesthesia: Monitor Anesthesia Care | Site: Eye | Laterality: Right

## 2022-07-06 MED ORDER — MIDAZOLAM HCL 2 MG/2ML IJ SOLN
INTRAMUSCULAR | Status: DC | PRN
  Administered 2022-07-06: 1 mg via INTRAVENOUS

## 2022-07-06 MED ORDER — PHENYLEPHRINE HCL 2.5 % OP SOLN
1.0000 [drp] | OPHTHALMIC | Status: AC | PRN
  Administered 2022-07-06 (×3): 1 [drp] via OPHTHALMIC

## 2022-07-06 MED ORDER — EPINEPHRINE PF 1 MG/ML IJ SOLN
INTRAMUSCULAR | Status: AC
  Filled 2022-07-06: qty 2

## 2022-07-06 MED ORDER — TETRACAINE HCL 0.5 % OP SOLN
1.0000 [drp] | OPHTHALMIC | Status: AC | PRN
  Administered 2022-07-06 (×3): 1 [drp] via OPHTHALMIC

## 2022-07-06 MED ORDER — SODIUM HYALURONATE 10 MG/ML IO SOLUTION
PREFILLED_SYRINGE | INTRAOCULAR | Status: DC | PRN
  Administered 2022-07-06: 0.85 mL via INTRAOCULAR

## 2022-07-06 MED ORDER — LIDOCAINE HCL (PF) 1 % IJ SOLN
INTRAOCULAR | Status: DC | PRN
  Administered 2022-07-06: 1 mL via OPHTHALMIC

## 2022-07-06 MED ORDER — EPINEPHRINE PF 1 MG/ML IJ SOLN
INTRAOCULAR | Status: DC | PRN
  Administered 2022-07-06: 500 mL

## 2022-07-06 MED ORDER — SODIUM HYALURONATE 23MG/ML IO SOSY
PREFILLED_SYRINGE | INTRAOCULAR | Status: DC | PRN
  Administered 2022-07-06: 0.6 mL via INTRAOCULAR

## 2022-07-06 MED ORDER — STERILE WATER FOR IRRIGATION IR SOLN
Status: DC | PRN
  Administered 2022-07-06: 250 mL

## 2022-07-06 MED ORDER — BSS IO SOLN
INTRAOCULAR | Status: DC | PRN
  Administered 2022-07-06: 15 mL via INTRAOCULAR

## 2022-07-06 MED ORDER — MIDAZOLAM HCL 2 MG/2ML IJ SOLN
INTRAMUSCULAR | Status: AC
  Filled 2022-07-06: qty 2

## 2022-07-06 MED ORDER — SODIUM CHLORIDE 0.9% FLUSH
INTRAVENOUS | Status: DC | PRN
  Administered 2022-07-06: 5 mL via INTRAVENOUS

## 2022-07-06 MED ORDER — LACTATED RINGERS IV SOLN
INTRAVENOUS | Status: DC
Start: 2022-07-06 — End: 2022-07-06

## 2022-07-06 MED ORDER — LIDOCAINE HCL 3.5 % OP GEL
1.0000 | Freq: Once | OPHTHALMIC | Status: AC
  Administered 2022-07-06: 1 via OPHTHALMIC

## 2022-07-06 MED ORDER — TROPICAMIDE 1 % OP SOLN
1.0000 [drp] | OPHTHALMIC | Status: AC | PRN
  Administered 2022-07-06 (×3): 1 [drp] via OPHTHALMIC

## 2022-07-06 MED ORDER — POVIDONE-IODINE 5 % OP SOLN
OPHTHALMIC | Status: DC | PRN
  Administered 2022-07-06: 1 via OPHTHALMIC

## 2022-07-06 MED ORDER — NEOMYCIN-POLYMYXIN-DEXAMETH 3.5-10000-0.1 OP SUSP
OPHTHALMIC | Status: DC | PRN
  Administered 2022-07-06: 2 [drp] via OPHTHALMIC

## 2022-07-06 SURGICAL SUPPLY — 14 items
CATARACT SUITE SIGHTPATH (MISCELLANEOUS) ×2 IMPLANT
CLOTH BEACON ORANGE TIMEOUT ST (SAFETY) ×2 IMPLANT
EYE SHIELD UNIVERSAL CLEAR (GAUZE/BANDAGES/DRESSINGS) ×1 IMPLANT
FEE CATARACT SUITE SIGHTPATH (MISCELLANEOUS) ×1 IMPLANT
GLOVE BIOGEL PI IND STRL 7.0 (GLOVE) ×2 IMPLANT
GLOVE BIOGEL PI INDICATOR 7.0 (GLOVE) ×1
GLOVE ECLIPSE 6.5 STRL STRAW (GLOVE) ×1 IMPLANT
LENS IOL EYHANCE TRC 150 25.5 IMPLANT
LENS IOL TORIC DIU150 25.5 ×2 IMPLANT
PAD ARMBOARD 7.5X6 YLW CONV (MISCELLANEOUS) ×2 IMPLANT
SYR TB 1ML LL NO SAFETY (SYRINGE) ×2 IMPLANT
TAPE SURG TRANSPORE 1 IN (GAUZE/BANDAGES/DRESSINGS) IMPLANT
TAPE SURGICAL TRANSPORE 1 IN (GAUZE/BANDAGES/DRESSINGS) ×1
WATER STERILE IRR 250ML POUR (IV SOLUTION) ×2 IMPLANT

## 2022-07-06 NOTE — Op Note (Signed)
Date of procedure: 07/06/22  Pre-operative diagnosis: Visually significant age-related cataract, Right Eye; Visually Significant Astigmatism, Right Eye (H25.?1)  Post-operative diagnosis: Visually significant age-related cataract, Right Eye; Visually Significant Astigmatism, Right Eye  Procedure: Removal of cataract via phacoemulsification and insertion of intra-ocular lens Wynetta Emery and Johnson DIU150 +25.5D into the capsular bag of the Right Eye  Attending surgeon: Gerda Diss. Jerol Rufener, MD, MA  Anesthesia: MAC, Topical Akten  Complications: None  Estimated Blood Loss: <70m (minimal)  Specimens: None  Implants: As above  Indications:  Visually significant age-related cataract, Right Eye; Visually Significant Astigmatism, Right Eye  Procedure:  The patient was seen and identified in the pre-operative area. The operative eye was identified and dilated.  The operative eye was marked.  Pre-operative toric markers were used to mark the eye at 0 and 180 degrees. Topical anesthesia was administered to the operative eye.     The patient was then to the operative suite and placed in the supine position.  A timeout was performed confirming the patient, procedure to be performed, and all other relevant information.   The patient's face was prepped and draped in the usual fashion for intra-ocular surgery.  A lid speculum was placed into the operative eye and the surgical microscope moved into place and focused.  A superotemporal paracentesis was created using a 20 gauge paracentesis blade.  Shugarcaine was injected into the anterior chamber.  Viscoelastic was injected into the anterior chamber.  A temporal clear-corneal main wound incision was created using a 2.473mmicrokeratome.  A continuous curvilinear capsulorrhexis was initiated using an irrigating cystitome and completed using capsulorrhexis forceps.  Hydrodissection and hydrodeliniation were performed.  Viscoelastic was injected into the anterior  chamber.  A phacoemulsification handpiece and a chopper as a second instrument were used to remove the nucleus and epinucleus. The irrigation/aspiration handpiece was used to remove any remaining cortical material.   The capsular bag was reinflated with viscoelastic, checked, and found to be intact.  The intraocular lens was inserted into the capsular bag and dialed into place using a Kuglen hook to 71 degrees.  The irrigation/aspiration handpiece was used to remove any remaining viscoelastic.  The clear corneal wound and paracentesis wounds were then hydrated and checked with Weck-Cels to be watertight.  The lid-speculum and drape was removed, and the patient's face was cleaned with a wet and dry 4x4.  Maxitrol was instilled in the eye before a clear shield was taped over the eye. The patient was taken to the post-operative care unit in good condition, having tolerated the procedure well.  Post-Op Instructions: [facility] and will receive all other orders and instructions.

## 2022-07-06 NOTE — Discharge Instructions (Addendum)
Please discharge patient when stable, will follow up today with Dr. Marisa Hua at the [facility] office at 10:30 following discharge.  Leave shield in place until visit.  All paperwork with discharge instructions will be given at the office.  [facility] Address:  [address]  [place], [place] [address]

## 2022-07-06 NOTE — Anesthesia Procedure Notes (Signed)
Procedure Name: MAC Date/Time: 07/06/2022 9:23 AM  Performed by: Orlie Dakin, CRNAPre-anesthesia Checklist: Patient identified, Emergency Drugs available, Suction available and Patient being monitored Patient Re-evaluated:Patient Re-evaluated prior to induction Oxygen Delivery Method: Simple face mask Placement Confirmation: positive ETCO2

## 2022-07-06 NOTE — Anesthesia Preprocedure Evaluation (Signed)
Anesthesia Evaluation  Patient identified by MRN, date of birth, ID band Patient awake    Reviewed: Allergy & Precautions, NPO status , Patient's Chart, lab work & pertinent test results  Airway Mallampati: I  TM Distance: >3 FB Neck ROM: Full    Dental  (+) Dental Advisory Given, Chipped,  Crown, bridge :   Pulmonary shortness of breath and with exertion, pneumonia,    Pulmonary exam normal breath sounds clear to auscultation       Cardiovascular Exercise Tolerance: Good hypertension, Pt. on medications Normal cardiovascular exam Rhythm:Regular Rate:Normal     Neuro/Psych negative neurological ROS  negative psych ROS   GI/Hepatic Neg liver ROS, hiatal hernia, GERD  Medicated and Controlled,  Endo/Other  negative endocrine ROS  Renal/GU negative Renal ROS  negative genitourinary   Musculoskeletal  (+) Arthritis , Osteoarthritis,    Abdominal   Peds negative pediatric ROS (+)  Hematology negative hematology ROS (+)   Anesthesia Other Findings   Reproductive/Obstetrics negative OB ROS                           Anesthesia Physical Anesthesia Plan  ASA: 3  Anesthesia Plan: MAC   Post-op Pain Management: Minimal or no pain anticipated   Induction:   PONV Risk Score and Plan: 0  Airway Management Planned: Natural Airway and Nasal Cannula  Additional Equipment:   Intra-op Plan:   Post-operative Plan:   Informed Consent: I have reviewed the patients History and Physical, chart, labs and discussed the procedure including the risks, benefits and alternatives for the proposed anesthesia with the patient or authorized representative who has indicated his/her understanding and acceptance.     Dental advisory given  Plan Discussed with: Surgeon and CRNA  Anesthesia Plan Comments:        Anesthesia Quick Evaluation  

## 2022-07-06 NOTE — Transfer of Care (Signed)
Immediate Anesthesia Transfer of Care Note  Patient: Anglea Gordner  Procedure(s) Performed: CATARACT EXTRACTION PHACO AND INTRAOCULAR LENS PLACEMENT (IOC) (Right: Eye)  Patient Location: Short Stay  Anesthesia Type:MAC  Level of Consciousness: awake, alert  and oriented  Airway & Oxygen Therapy: Patient Spontanous Breathing  Post-op Assessment: Report given to RN and Post -op Vital signs reviewed and stable  Post vital signs: Reviewed and stable  Last Vitals:  Vitals Value Taken Time  BP    Temp    Pulse    Resp    SpO2      Last Pain:  Vitals:   07/06/22 0838  TempSrc: Oral  PainSc: 0-No pain         Complications: No notable events documented.

## 2022-07-06 NOTE — Interval H&P Note (Signed)
History and Physical Interval Note:  07/06/2022 9:20 AM  Rebekah Taylor  has presented today for surgery, with the diagnosis of combined forms age related cataract; right.  The various methods of treatment have been discussed with the patient and family. After consideration of risks, benefits and other options for treatment, the patient has consented to  Procedure(s) with comments: CATARACT EXTRACTION PHACO AND INTRAOCULAR LENS PLACEMENT (IOC) (Right) - CDE:  as a surgical intervention.  The patient's history has been reviewed, patient examined, no change in status, stable for surgery.  I have reviewed the patient's chart and labs.  Questions were answered to the patient's satisfaction.     Baruch Goldmann

## 2022-07-07 ENCOUNTER — Encounter (HOSPITAL_COMMUNITY): Payer: Self-pay | Admitting: Ophthalmology

## 2022-07-07 NOTE — Anesthesia Postprocedure Evaluation (Signed)
Anesthesia Post Note  Patient: Rebekah Taylor  Procedure(s) Performed: CATARACT EXTRACTION PHACO AND INTRAOCULAR LENS PLACEMENT (IOC) (Right: Eye)  Patient location during evaluation: Phase II Anesthesia Type: MAC Level of consciousness: awake Pain management: pain level controlled Vital Signs Assessment: post-procedure vital signs reviewed and stable Respiratory status: spontaneous breathing and respiratory function stable Cardiovascular status: blood pressure returned to baseline and stable Postop Assessment: no headache and no apparent nausea or vomiting Anesthetic complications: no Comments: Late entry   No notable events documented.   Last Vitals:  Vitals:   07/06/22 0838 07/06/22 0942  BP: (!) 154/71 (!) 152/74  Pulse: (!) 55 (!) 56  Resp: (!) 22 (!) 22  Temp: 36.8 C 36.9 C  SpO2: 94% 98%    Last Pain:  Vitals:   07/06/22 0942  TempSrc: Oral  PainSc: 0-No pain                 Louann Sjogren
